# Patient Record
Sex: Male | Born: 1970 | Race: Black or African American | Hispanic: No | Marital: Married | Smoking: Never smoker
Health system: Southern US, Community
[De-identification: ages and names within clinical notes are randomized; demographics above are authoritative.]

## PROBLEM LIST (undated history)

## (undated) DIAGNOSIS — E119 Type 2 diabetes mellitus without complications: Secondary | ICD-10-CM

## (undated) HISTORY — PX: FRACTURE SURGERY: SHX138

---

## 2006-07-24 ENCOUNTER — Emergency Department: Payer: Self-pay | Admitting: Unknown Physician Specialty

## 2007-04-06 ENCOUNTER — Emergency Department: Payer: Self-pay | Admitting: Emergency Medicine

## 2007-04-06 ENCOUNTER — Other Ambulatory Visit: Payer: Self-pay

## 2007-07-23 ENCOUNTER — Emergency Department: Payer: Self-pay | Admitting: Emergency Medicine

## 2007-07-24 ENCOUNTER — Ambulatory Visit: Payer: Self-pay | Admitting: Emergency Medicine

## 2007-07-24 ENCOUNTER — Emergency Department (HOSPITAL_COMMUNITY): Admission: EM | Admit: 2007-07-24 | Discharge: 2007-07-25 | Payer: Self-pay | Admitting: Emergency Medicine

## 2007-12-10 ENCOUNTER — Ambulatory Visit: Payer: Self-pay | Admitting: Internal Medicine

## 2007-12-17 ENCOUNTER — Ambulatory Visit: Payer: Self-pay | Admitting: Internal Medicine

## 2007-12-23 ENCOUNTER — Emergency Department (HOSPITAL_COMMUNITY): Admission: EM | Admit: 2007-12-23 | Discharge: 2007-12-23 | Payer: Self-pay | Admitting: Emergency Medicine

## 2008-01-21 ENCOUNTER — Ambulatory Visit (HOSPITAL_COMMUNITY): Admission: RE | Admit: 2008-01-21 | Discharge: 2008-01-21 | Payer: Self-pay | Admitting: Nephrology

## 2008-01-26 ENCOUNTER — Ambulatory Visit (HOSPITAL_COMMUNITY): Admission: RE | Admit: 2008-01-26 | Discharge: 2008-01-26 | Payer: Self-pay | Admitting: Nephrology

## 2008-03-24 ENCOUNTER — Ambulatory Visit (HOSPITAL_COMMUNITY): Admission: RE | Admit: 2008-03-24 | Discharge: 2008-03-24 | Payer: Self-pay | Admitting: Nephrology

## 2008-12-20 ENCOUNTER — Emergency Department (HOSPITAL_COMMUNITY): Admission: EM | Admit: 2008-12-20 | Discharge: 2008-12-20 | Payer: Self-pay | Admitting: *Deleted

## 2010-03-24 ENCOUNTER — Emergency Department: Payer: Self-pay | Admitting: Emergency Medicine

## 2010-12-05 ENCOUNTER — Emergency Department: Payer: Self-pay | Admitting: Emergency Medicine

## 2010-12-23 ENCOUNTER — Encounter: Payer: Self-pay | Admitting: Nephrology

## 2011-03-23 ENCOUNTER — Emergency Department (HOSPITAL_COMMUNITY)
Admission: EM | Admit: 2011-03-23 | Discharge: 2011-03-23 | Disposition: A | Discharge status: Home / Self Care | Payer: No Typology Code available for payment source | Attending: Emergency Medicine | Admitting: Emergency Medicine

## 2011-03-23 DIAGNOSIS — IMO0002 Reserved for concepts with insufficient information to code with codable children: Secondary | ICD-10-CM | POA: Insufficient documentation

## 2011-03-23 DIAGNOSIS — Y92410 Unspecified street and highway as the place of occurrence of the external cause: Secondary | ICD-10-CM | POA: Insufficient documentation

## 2012-07-30 ENCOUNTER — Emergency Department: Payer: Self-pay | Admitting: Unknown Physician Specialty

## 2012-08-05 ENCOUNTER — Emergency Department: Payer: Self-pay | Admitting: *Deleted

## 2012-08-05 LAB — BASIC METABOLIC PANEL
Creatinine: 1.26 mg/dL (ref 0.60–1.30)
EGFR (African American): >60
EGFR (Non-African Amer.): >60
Glucose: 321 mg/dL — ABNORMAL HIGH (ref 65–99)
Sodium: 136 mmol/L (ref 136–145)

## 2012-08-05 LAB — TROPONIN I: Troponin-I: <0.02 ng/mL

## 2012-08-05 LAB — CBC
MCV: 79 fL — ABNORMAL LOW (ref 80–100)
WBC: 11.3 10*3/uL — ABNORMAL HIGH (ref 3.8–10.6)

## 2012-12-05 ENCOUNTER — Emergency Department: Payer: Self-pay | Admitting: Emergency Medicine

## 2012-12-05 LAB — COMPREHENSIVE METABOLIC PANEL
Alkaline Phosphatase: 109 U/L (ref 50–136)
Bilirubin,Total: 0.3 mg/dL (ref 0.2–1.0)
Calcium, Total: 9.8 mg/dL (ref 8.5–10.1)
Chloride: 99 mmol/L (ref 98–107)
Co2: 27 mmol/L (ref 21–32)
Creatinine: 1.29 mg/dL (ref 0.60–1.30)
EGFR (African American): >60
EGFR (Non-African Amer.): >60
Glucose: 466 mg/dL — ABNORMAL HIGH (ref 65–99)
SGOT(AST): 13 U/L — ABNORMAL LOW (ref 15–37)
Sodium: 135 mmol/L — ABNORMAL LOW (ref 136–145)

## 2012-12-05 LAB — URINALYSIS, COMPLETE
Bacteria: NONE SEEN
Bilirubin,UR: NEGATIVE
Blood: NEGATIVE
Nitrite: NEGATIVE
Ph: 5 (ref 4.5–8.0)
Protein: NEGATIVE
RBC,UR: <1 /HPF (ref 0–5)
Specific Gravity: 1.033 (ref 1.003–1.030)
Squamous Epithelial: <1
WBC UR: NONE SEEN /HPF (ref 0–5)

## 2012-12-05 LAB — CBC WITH DIFFERENTIAL/PLATELET
Basophil #: 0.2 10*3/uL — ABNORMAL HIGH (ref 0.0–0.1)
Basophil %: 1.1 %
Eosinophil #: 0.1 10*3/uL (ref 0.0–0.7)
Eosinophil %: 0.3 %
Lymphocyte %: 16.7 %
MCV: 81 fL (ref 80–100)
Monocyte #: 1.5 x10 3/mm — ABNORMAL HIGH (ref 0.2–1.0)
Monocyte %: 6.7 %
Neutrophil #: 16.8 10*3/uL — ABNORMAL HIGH (ref 1.4–6.5)
Platelet: 241 10*3/uL (ref 150–440)
RDW: 14.6 % — ABNORMAL HIGH (ref 11.5–14.5)
WBC: 22.4 10*3/uL — ABNORMAL HIGH (ref 3.8–10.6)

## 2012-12-07 LAB — BETA STREP CULTURE(ARMC)

## 2014-04-03 ENCOUNTER — Emergency Department: Payer: Self-pay | Admitting: Emergency Medicine

## 2014-04-04 LAB — URINALYSIS, COMPLETE
BLOOD: NEGATIVE
Bacteria: NONE SEEN
Bilirubin,UR: NEGATIVE
Glucose,UR: NEGATIVE mg/dL (ref 0–75)
Ketone: NEGATIVE
Leukocyte Esterase: NEGATIVE
NITRITE: NEGATIVE
Ph: 5 (ref 4.5–8.0)
Protein: NEGATIVE
Specific Gravity: 1.023 (ref 1.003–1.030)
WBC UR: <1 /HPF (ref 0–5)

## 2014-04-04 LAB — BASIC METABOLIC PANEL
Anion Gap: 6 — ABNORMAL LOW (ref 7–16)
BUN: 15 mg/dL (ref 7–18)
CHLORIDE: 103 mmol/L (ref 98–107)
CREATININE: 1.1 mg/dL (ref 0.60–1.30)
Calcium, Total: 9.3 mg/dL (ref 8.5–10.1)
Co2: 28 mmol/L (ref 21–32)
EGFR (Non-African Amer.): >60
Glucose: 160 mg/dL — ABNORMAL HIGH (ref 65–99)
Osmolality: 278 (ref 275–301)
POTASSIUM: 4 mmol/L (ref 3.5–5.1)
SODIUM: 137 mmol/L (ref 136–145)

## 2014-04-04 LAB — CBC WITH DIFFERENTIAL/PLATELET
BASOS ABS: 0 10*3/uL (ref 0.0–0.1)
BASOS PCT: 0.3 %
EOS ABS: 0.2 10*3/uL (ref 0.0–0.7)
EOS PCT: 1.6 %
HCT: 43.9 % (ref 40.0–52.0)
HGB: 14.2 g/dL (ref 13.0–18.0)
LYMPHS PCT: 36.7 %
Lymphocyte #: 4.7 10*3/uL — ABNORMAL HIGH (ref 1.0–3.6)
MCH: 26 pg (ref 26.0–34.0)
MCHC: 32.4 g/dL (ref 32.0–36.0)
MCV: 80 fL (ref 80–100)
Monocyte #: 0.9 x10 3/mm (ref 0.2–1.0)
Monocyte %: 7.3 %
NEUTROS ABS: 7 10*3/uL — AB (ref 1.4–6.5)
NEUTROS PCT: 54.1 %
PLATELETS: 252 10*3/uL (ref 150–440)
RBC: 5.46 10*6/uL (ref 4.40–5.90)
RDW: 14.6 % — ABNORMAL HIGH (ref 11.5–14.5)
WBC: 12.9 10*3/uL — AB (ref 3.8–10.6)

## 2014-04-04 LAB — TROPONIN I: Troponin-I: <0.02 ng/mL

## 2014-05-26 ENCOUNTER — Emergency Department: Payer: Self-pay | Admitting: Emergency Medicine

## 2014-05-26 LAB — URIC ACID: URIC ACID: 6.3 mg/dL (ref 3.5–7.2)

## 2015-04-12 ENCOUNTER — Encounter: Payer: Self-pay | Admitting: Emergency Medicine

## 2015-04-12 ENCOUNTER — Emergency Department
Admission: EM | Admit: 2015-04-12 | Discharge: 2015-04-12 | Discharge status: Against Medical Advice | Payer: Self-pay | Attending: Emergency Medicine | Admitting: Emergency Medicine

## 2015-04-12 DIAGNOSIS — K088 Other specified disorders of teeth and supporting structures: Secondary | ICD-10-CM | POA: Insufficient documentation

## 2015-04-12 DIAGNOSIS — R51 Headache: Secondary | ICD-10-CM | POA: Insufficient documentation

## 2015-04-12 HISTORY — DX: Type 2 diabetes mellitus without complications: E11.9

## 2015-04-12 NOTE — ED Notes (Signed)
Patient ambulatory to triage with steady gait, without difficulty or distress noted; pt reports left sided HA with left toothache tonight

## 2015-10-16 ENCOUNTER — Encounter: Payer: Self-pay | Admitting: Emergency Medicine

## 2015-10-16 ENCOUNTER — Emergency Department
Admission: EM | Admit: 2015-10-16 | Discharge: 2015-10-16 | Disposition: A | Discharge status: Home / Self Care | Payer: BLUE CROSS/BLUE SHIELD | Attending: Emergency Medicine | Admitting: Emergency Medicine

## 2015-10-16 ENCOUNTER — Emergency Department: Payer: BLUE CROSS/BLUE SHIELD

## 2015-10-16 DIAGNOSIS — Z880 Allergy status to penicillin: Secondary | ICD-10-CM | POA: Insufficient documentation

## 2015-10-16 DIAGNOSIS — M5440 Lumbago with sciatica, unspecified side: Secondary | ICD-10-CM | POA: Diagnosis not present

## 2015-10-16 DIAGNOSIS — M79642 Pain in left hand: Secondary | ICD-10-CM | POA: Insufficient documentation

## 2015-10-16 DIAGNOSIS — Z7984 Long term (current) use of oral hypoglycemic drugs: Secondary | ICD-10-CM | POA: Diagnosis not present

## 2015-10-16 DIAGNOSIS — M79641 Pain in right hand: Secondary | ICD-10-CM | POA: Insufficient documentation

## 2015-10-16 DIAGNOSIS — H109 Unspecified conjunctivitis: Secondary | ICD-10-CM | POA: Insufficient documentation

## 2015-10-16 DIAGNOSIS — E119 Type 2 diabetes mellitus without complications: Secondary | ICD-10-CM | POA: Diagnosis not present

## 2015-10-16 DIAGNOSIS — M25551 Pain in right hip: Secondary | ICD-10-CM | POA: Diagnosis not present

## 2015-10-16 DIAGNOSIS — H5712 Ocular pain, left eye: Secondary | ICD-10-CM | POA: Diagnosis present

## 2015-10-16 MED ORDER — CYCLOBENZAPRINE HCL 10 MG PO TABS: 10.0000 mg | ORAL_TABLET | Freq: Three times a day (TID) | ORAL | Status: DC | PRN

## 2015-10-16 MED ORDER — IBUPROFEN 800 MG PO TABS: 800.0000 mg | ORAL_TABLET | Freq: Three times a day (TID) | ORAL | Status: DC | PRN

## 2015-10-16 MED ORDER — SULFACETAMIDE SODIUM 10 % OP SOLN: 2.0000 [drp] | Freq: Four times a day (QID) | OPHTHALMIC | Status: DC

## 2015-10-16 NOTE — ED Provider Notes (Signed)
[facility] Emergency Department Provider Note  ____________________________________________  Time seen: Approximately 10:32 AM  I have reviewed the triage vital signs and the nursing notes.   HISTORY  Chief Complaint Hip Pain; Eye Pain; and Hand Pain    HPI Jerome Conner is a 44 y.o. male presents with complaints of right hip pain with numbness and tingling in his groin. Pain with numbness extending down his leg. Also complains of left eye itching and draining for 3 days, and also complains of bilateral hand pain. 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 Past medical history significant for diabetes without complication.   Past Medical History  Diagnosis Date  . Diabetes mellitus without complication (HCC)     There are no active problems to display for this patient.   History reviewed. No pertinent past surgical history.  Current Outpatient Rx  Name  Route  Sig  Dispense  Refill  . glipiZIDE (GLUCOTROL) 10 MG tablet   Oral   Take 10 mg by mouth daily before breakfast.         . metFORMIN (GLUCOPHAGE) 500 MG tablet   Oral   Take 500 mg by mouth 2 (two) times daily with a meal.         . cyclobenzaprine (FLEXERIL) 10 MG tablet   Oral   Take 1 tablet (10 mg total) by mouth every 8 (eight) hours as needed for muscle spasms.   30 tablet   1   . ibuprofen (ADVIL,MOTRIN) 800 MG tablet   Oral   Take 1 tablet (800 mg total) by mouth every 8 (eight) hours as needed.   30 tablet   0   . sulfacetamide (BLEPH-10) 10 % ophthalmic solution   Both Eyes   Place 2 drops into both eyes 4 (four) times daily.   5 mL   0     Allergies Penicillins  History reviewed. No pertinent family history.  Social History Social History  Substance Use Topics  . Smoking status: Never Smoker   . Smokeless tobacco: None  . Alcohol Use: No    Review of Systems Constitutional: No  fever/chills Eyes: No visual changes. Positive drainage left eye. ENT: No sore throat. Cardiovascular: Denies chest pain. Respiratory: Denies shortness of breath. Gastrointestinal: No abdominal pain.  No nausea, no vomiting.  No diarrhea.  No constipation. Genitourinary: Negative for dysuria. Musculoskeletal: Positive for bilateral hand pain and right hip pain with paresthesia and numbness. Skin: Negative for rash. Neurological: Negative for headaches, focal weakness or numbness.  10-point ROS otherwise negative.  ____________________________________________   PHYSICAL EXAM:  VITAL SIGNS: ED Triage Vitals  Enc Vitals Group     BP --      Pulse --      Resp --      Temp --      Temp src --      SpO2 --      Weight --      Height --      Head Cir --      Peak Flow --      Pain Score 10/16/15 0944 8     Pain Loc --      Pain Edu? --      Excl. in GC? --     Constitutional: Alert and oriented. Well appearing and in no acute distress. Eyes: Left eye with some conjunctival irritation. Drainage noted. EOMI  PERRLA Head: Atraumatic. Nose: No congestion/rhinnorhea. Mouth/Throat: Mucous membranes are moist.  Oropharynx non-erythematous. Neck: No stridor. No Cervical spinal tenderness to palpation Cardiovascular: Normal rate, regular rhythm. Grossly normal heart sounds.  Good peripheral circulation. Respiratory: Normal respiratory effort.  No retractions. Lungs CTAB. Gastrointestinal: Soft and nontender. No distention. No abdominal bruits. No CVA tenderness. Musculoskeletal: No lower extremity tenderness nor edema.  No joint effusions. Neurologic:  Normal speech and language. No gross focal neurologic deficits are appreciated. No gait instability. Skin:  Skin is warm, dry and intact. No rash noted. Psychiatric: Mood and affect are normal. Speech and behavior are normal.  ____________________________________________   LABS (all labs ordered are listed, but only abnormal  results are displayed)  Labs Reviewed - No data to display ____________________________________________   RADIOLOGY  MRI lumbar spine negative for cauda equina syndrome no evidence of DJD. Bilateral hand x-rays both negative and no evidence of osteoarthritis. ____________________________________________   PROCEDURES  Procedure(s) performed: None  Critical Care performed: No  ____________________________________________   INITIAL IMPRESSION / ASSESSMENT AND PLAN / ED COURSE  Pertinent labs & imaging results that were available during my care of the patient were reviewed by me and considered in my medical decision making (see chart for details).  1.left eye conjunctivitis Rx given for sodium Sulamyd eyedrops. 2. Bilateral hand pain Rx given for Motrin 800 mg 3 times a day. 3. Low back pain Rx given for Flexeril 10 mg 3 times a day patient is follow-up PCP or return to ER with any worsening symptomology. ____________________________________________   FINAL CLINICAL IMPRESSION(S) / ED DIAGNOSES  Final diagnoses:  Conjunctivitis of left eye  Low back pain with sciatica, sciatica laterality unspecified, unspecified back pain laterality      Evangeline DakinCharles M Khloe Hunkele, PA-C 10/16/15 1654  Jene Everyobert Kinner, MD 10/19/15 1120

## 2015-10-16 NOTE — ED Notes (Signed)
Pt to ed with c/o right hip pain, states " my doctor sent me for an x ray",  Also c/o left eye pain, +itching, drainage, x 3 days, also c/o bilat hand pain, denies injury, states pain with gripping.

## 2015-10-16 NOTE — Discharge Instructions (Signed)
Bacterial Conjunctivitis Bacterial conjunctivitis, commonly called pink eye, is an inflammation of the clear membrane that covers the white part of the eye (conjunctiva). The inflammation can also happen on the underside of the eyelids. The blood vessels in the conjunctiva become inflamed, causing the eye to become red or pink. Bacterial conjunctivitis may spread easily from one eye to another and from person to person (contagious).  CAUSES  Bacterial conjunctivitis is caused by bacteria. The bacteria may come from your own skin, your upper respiratory tract, or from someone else with bacterial conjunctivitis. SYMPTOMS  The normally white color of the eye or the underside of the eyelid is usually pink or red. The pink eye is usually associated with irritation, tearing, and some sensitivity to light. Bacterial conjunctivitis is often associated with a thick, yellowish discharge from the eye. The discharge may turn into a crust on the eyelids overnight, which causes your eyelids to stick together. If a discharge is present, there may also be some blurred vision in the affected eye. DIAGNOSIS  Bacterial conjunctivitis is diagnosed by your caregiver through an eye exam and the symptoms that you report. Your caregiver looks for changes in the surface tissues of your eyes, which may point to the specific type of conjunctivitis. A sample of any discharge may be collected on a cotton-tip swab if you have a severe case of conjunctivitis, if your cornea is affected, or if you keep getting repeat infections that do not respond to treatment. The sample will be sent to a lab to see if the inflammation is caused by a bacterial infection and to see if the infection will respond to antibiotic medicines. TREATMENT   Bacterial conjunctivitis is treated with antibiotics. Antibiotic eyedrops are most often used. However, antibiotic ointments are also available. Antibiotics pills are sometimes used. Artificial tears or eye  washes may ease discomfort. HOME CARE INSTRUCTIONS   To ease discomfort, apply a cool, clean washcloth to your eye for 10-20 minutes, 3-4 times a day.  Gently wipe away any drainage from your eye with a warm, wet washcloth or a cotton ball.  Wash your hands often with soap and water. Use paper towels to dry your hands.  Do not share towels or washcloths. This may spread the infection.  Change or wash your pillowcase every day.  You should not use eye makeup until the infection is gone.  Do not operate machinery or drive if your vision is blurred.  Stop using contact lenses. Ask your caregiver how to sterilize or replace your contacts before using them again. This depends on the type of contact lenses that you use.  When applying medicine to the infected eye, do not touch the edge of your eyelid with the eyedrop bottle or ointment tube. SEEK IMMEDIATE MEDICAL CARE IF:   Your infection has not improved within 3 days after beginning treatment.  You had yellow discharge from your eye and it returns.  You have increased eye pain.  Your eye redness is spreading.  Your vision becomes blurred.  You have a fever or persistent symptoms for more than 2-3 days.  You have a fever and your symptoms suddenly get worse.  You have facial pain, redness, or swelling. MAKE SURE YOU:   Understand these instructions.  Will watch your condition.  Will get help right away if you are not doing well or get worse.   This information is not intended to replace advice given to you by your health care provider. Make sure you   discuss any questions you have with your health care provider.   Document Released: 11/18/2005 Document Revised: 12/09/2014 Document Reviewed: 04/20/2012 Elsevier Interactive Patient Education 2016 [employer]  Radicular Pain Radicular pain in either the arm or leg is usually from a bulging or herniated disk in the spine. A piece of the herniated disk may press against  the nerves as the nerves exit the spine. This causes pain which is felt at the tips of the nerves down the arm or leg. Other causes of radicular pain may include:  Fractures.  Heart disease.  Cancer.  An abnormal and usually degenerative state of the nervous system or nerves (neuropathy). Diagnosis may require CT or MRI scanning to determine the primary cause.  Nerves that start at the neck (nerve roots) may cause radicular pain in the outer shoulder and arm. It can spread down to the thumb and fingers. The symptoms vary depending on which nerve root has been affected. In most cases radicular pain improves with conservative treatment. Neck problems may require physical therapy, a neck collar, or cervical traction. Treatment may take many weeks, and surgery may be considered if the symptoms do not improve.  Conservative treatment is also recommended for sciatica. Sciatica causes pain to radiate from the lower back or buttock area down the leg into the foot. Often there is a history of back problems. Most patients with sciatica are better after 2 to 4 weeks of rest and other supportive care. Short term bed rest can reduce the disk pressure considerably. Sitting, however, is not a good position since this increases the pressure on the disk. You should avoid bending, lifting, and all other activities which make the problem worse. Traction can be used in severe cases. Surgery is usually reserved for patients who do not improve within the first months of treatment. Only take over-the-counter or prescription medicines for pain, discomfort, or fever as directed by your caregiver. Narcotics and muscle relaxants may help by relieving more severe pain and spasm and by providing mild sedation. Cold or massage can give significant relief. Spinal manipulation is not recommended. It can increase the degree of disc protrusion. Epidural steroid injections are often effective treatment for radicular pain. These injections  deliver medicine to the spinal nerve in the space between the protective covering of the spinal cord and back bones (vertebrae). Your caregiver can give you more information about steroid injections. These injections are most effective when given within two weeks of the onset of pain.  You should see your caregiver for follow up care as recommended. A program for neck and back injury rehabilitation with stretching and strengthening exercises is an important part of management.  SEEK IMMEDIATE MEDICAL CARE IF:  You develop increased pain, weakness, or numbness in your arm or leg.  You develop difficulty with bladder or bowel control.  You develop abdominal pain.   This information is not intended to replace advice given to you by your health care provider. Make sure you discuss any questions you have with your health care provider.   Document Released: 12/26/2004 Document Revised: 12/09/2014 Document Reviewed: 06/14/2015 Elsevier Interactive Patient Education 2016 [employer]  Sciatica Sciatica is pain, weakness, numbness, or tingling along your sciatic nerve. The nerve starts in the lower back and runs down the back of each leg. Nerve damage or certain conditions pinch or put pressure on the sciatic nerve. This causes the pain, weakness, and other discomforts of sciatica. HOME CARE   Only take medicine as told  by your doctor.  Apply ice to the affected area for 20 minutes. Do this 3-4 times a day for the first 48-72 hours. Then try heat in the same way.  Exercise, stretch, or do your usual activities if these do not make your pain worse.  Go to physical therapy as told by your doctor.  Keep all doctor visits as told.  Do not wear high heels or shoes that are not supportive.  Get a firm mattress if your mattress is too soft to lessen pain and discomfort. GET HELP RIGHT AWAY IF:   You cannot control when you poop (bowel movement) or pee (urinate).  You have more weakness in your  lower back, lower belly (pelvis), butt (buttocks), or legs.  You have redness or puffiness (swelling) of your back.  You have a burning feeling when you pee.  You have pain that gets worse when you lie down.  You have pain that wakes you from your sleep.  Your pain is worse than past pain.  Your pain lasts longer than 4 weeks.  You are suddenly losing weight without reason. MAKE SURE YOU:   Understand these instructions.  Will watch this condition.  Will get help right away if you are not doing well or get worse.   This information is not intended to replace advice given to you by your health care provider. Make sure you discuss any questions you have with your health care provider.   Document Released: 08/27/2008 Document Revised: 08/09/2015 Document Reviewed: 03/29/2012 Elsevier Interactive Patient Education [employer]

## 2016-04-11 ENCOUNTER — Emergency Department: Payer: BLUE CROSS/BLUE SHIELD

## 2016-04-11 ENCOUNTER — Encounter: Payer: Self-pay | Admitting: Emergency Medicine

## 2016-04-11 ENCOUNTER — Emergency Department
Admission: EM | Admit: 2016-04-11 | Discharge: 2016-04-11 | Disposition: A | Discharge status: Home / Self Care | Payer: BLUE CROSS/BLUE SHIELD | Attending: Emergency Medicine | Admitting: Emergency Medicine

## 2016-04-11 DIAGNOSIS — M7712 Lateral epicondylitis, left elbow: Secondary | ICD-10-CM | POA: Diagnosis not present

## 2016-04-11 DIAGNOSIS — Z7984 Long term (current) use of oral hypoglycemic drugs: Secondary | ICD-10-CM | POA: Insufficient documentation

## 2016-04-11 DIAGNOSIS — M25521 Pain in right elbow: Secondary | ICD-10-CM | POA: Diagnosis present

## 2016-04-11 DIAGNOSIS — E119 Type 2 diabetes mellitus without complications: Secondary | ICD-10-CM | POA: Insufficient documentation

## 2016-04-11 DIAGNOSIS — M7711 Lateral epicondylitis, right elbow: Secondary | ICD-10-CM

## 2016-04-11 MED ORDER — MELOXICAM 15 MG PO TABS: 15.0000 mg | ORAL_TABLET | Freq: Every day | ORAL | Status: DC

## 2016-04-11 NOTE — Discharge Instructions (Signed)
Tennis Elbow Tennis elbow (lateral epicondylitis) is inflammation of the outer tendons of your forearm close to your elbow. Your tendons attach your muscles to your bones. The outer tendons of your forearm are used to extend your wrist, and they attach on the outside part of your elbow. Tennis elbow is often found in people who play tennis, but anyone may get the condition from repeatedly extending the wrist or turning the forearm. CAUSES This condition is caused by repeatedly extending your wrist and using your hands. It can result from sports or work that requires repetitive forearm movements. Tennis elbow may also be caused by an injury. RISK FACTORS You have a higher risk of developing tennis elbow if you play tennis or another racquet sport. You also have a higher risk if you frequently use your hands for work. This condition is also more likely to develop in:  Musicians.  Carpenters, painters, and plumbers.  Cooks.  Cashiers.  People who work in factories.  Construction workers.  Butchers.  People who use computers. SYMPTOMS Symptoms of this condition include:  Pain and tenderness in your forearm and the outer part of your elbow. You may only feel the pain when you use your arm, or you may feel it even when you are not using your arm.  A burning feeling that runs from your elbow through your arm.  Weak grip in your hands. DIAGNOSIS  This condition may be diagnosed by medical history and physical exam. You may also have other tests, including:  X-rays.  MRI. TREATMENT Your health care provider will recommend lifestyle adjustments, such as resting and icing your arm. Treatment may also include:  Medicines for inflammation. This may include shots of cortisone if your pain continues.  Physical therapy. This may include massage or exercises.  An elbow brace. Surgery may eventually be recommended if your pain does not go away with treatment. HOME CARE  INSTRUCTIONS Activity  Rest your elbow and wrist as directed by your health care provider. Try to avoid any activities that caused the problem until your health care provider says that you can do them again.  If a physical therapist teaches you exercises, do all of them as directed.  If you lift an object, lift it with your palm facing upward. This lowers the stress on your elbow. Lifestyle  If your tennis elbow is caused by sports, check your equipment and make sure that:  You are using it correctly.  It is the best fit for you.  If your tennis elbow is caused by work, take breaks frequently, if you are able. Talk with your manager about how to best perform tasks in a way that is safe.  If your tennis elbow is caused by computer use, talk with your manager about any changes that can be made to your work environment. General Instructions  If directed, apply ice to the painful area:  Put ice in a plastic bag.  Place a towel between your skin and the bag.  Leave the ice on for 20 minutes, 2-3 times per day.  Take medicines only as directed by your health care provider.  If you were given a brace, wear it as directed by your health care provider.  Keep all follow-up visits as directed by your health care provider. This is important. SEEK MEDICAL CARE IF:  Your pain does not get better with treatment.  Your pain gets worse.  You have numbness or weakness in your forearm, hand, or fingers.     This information is not intended to replace advice given to you by your health care provider. Make sure you discuss any questions you have with your health care provider.   Document Released: 11/18/2005 Document Revised: 04/04/2015 Document Reviewed: 11/14/2014 Elsevier Interactive Patient Education 2016 [employer] Inc.  

## 2016-04-11 NOTE — ED Notes (Signed)
Developed pain to right elbow for about 1 month  Unsure of injury

## 2016-04-11 NOTE — ED Provider Notes (Signed)
[facility] Emergency Department Provider Note  ____________________________________________  Time seen: Approximately 6:47 PM  I have reviewed the triage vital signs and the nursing notes.   HISTORY  Chief Complaint Elbow Pain    HPI Jerome Conner is a 45 y.o. male who presents emergency department complaining of right elbow pain 1 month. Patient denies any injury precipitating this pain. Patient states that the pain is a burning sensation located on the lateral aspect of the right elbow. It occasionally radiates up to his shoulder and down to his wrist. Patient denies any numbness or tingling in his hands or wrist. Patient denies taking any medications prior to arrival. Patient denies any loss of range of motion to the arm.   Past Medical History  Diagnosis Date  . Diabetes mellitus without complication (HCC)     There are no active problems to display for this patient.   History reviewed. No pertinent past surgical history.  Current Outpatient Rx  Name  Route  Sig  Dispense  Refill  . cyclobenzaprine (FLEXERIL) 10 MG tablet   Oral   Take 1 tablet (10 mg total) by mouth every 8 (eight) hours as needed for muscle spasms.   30 tablet   1   . glipiZIDE (GLUCOTROL) 10 MG tablet   Oral   Take 10 mg by mouth daily before breakfast.         . ibuprofen (ADVIL,MOTRIN) 800 MG tablet   Oral   Take 1 tablet (800 mg total) by mouth every 8 (eight) hours as needed.   30 tablet   0   . meloxicam (MOBIC) 15 MG tablet   Oral   Take 1 tablet (15 mg total) by mouth daily.   30 tablet   0   . metFORMIN (GLUCOPHAGE) 500 MG tablet   Oral   Take 500 mg by mouth 2 (two) times daily with a meal.         . sulfacetamide (BLEPH-10) 10 % ophthalmic solution   Both Eyes   Place 2 drops into both eyes 4 (four) times daily.   5 mL   0     Allergies Penicillins  No family history on file.  Social History Social History  Substance Use Topics   . Smoking status: Never Smoker   . Smokeless tobacco: None  . Alcohol Use: No     Review of Systems  Constitutional: No fever/chills Cardiovascular: no chest pain. Respiratory: no cough. No SOB. Musculoskeletal: Positive for right elbow pain Skin: Negative for rash, abrasions, lacerations, ecchymosis. Neurological: Negative for headaches, focal weakness or numbness. 10-point ROS otherwise negative.  ____________________________________________   PHYSICAL EXAM:  VITAL SIGNS: ED Triage Vitals  Enc Vitals Group     BP 04/11/16 1747 141/98 mmHg     Pulse Rate 04/11/16 1747 83     Resp 04/11/16 1747 20     Temp 04/11/16 1747 99.1 F (37.3 C)     Temp Source 04/11/16 1747 Oral     SpO2 04/11/16 1747 98 %     Weight 04/11/16 1747 255 lb (115.667 kg)     Height 04/11/16 1747  (1.753 m)     Head Cir --      Peak Flow --      Pain Score 04/11/16 1746 9     Pain Loc --      Pain Edu? --      Excl. in GC? --      Constitutional: Alert  and oriented. Well appearing and in no acute distress. Eyes: Conjunctivae are normal. PERRL. EOMI. Head: Atraumatic. Cardiovascular: Normal rate, regular rhythm. Normal S1 and S2.  Good peripheral circulation. Respiratory: Normal respiratory effort without tachypnea or retractions. Lungs CTAB. Good air entry to the bases with no decreased or absent breath sounds. Musculoskeletal: Full range of motion to all extremities. No gross deformities appreciated.No deformities or edema are noted to the right elbow. Patient has full range of motion to elbow. Patient is tender to palpation right lateral aspect of the elbow. This tenderness to palpation is in the muscle belly just distal to lateral epicondyles. No palpable abnormality. Examination of the wrist and shoulder are unremarkable. Radial pulses appreciated. Sensation intact right upper extremity. Neurologic:  Normal speech and language. No gross focal neurologic deficits are appreciated.  Skin:   Skin is warm, dry and intact. No rash noted. Psychiatric: Mood and affect are normal. Speech and behavior are normal. Patient exhibits appropriate insight and judgement.   ____________________________________________   LABS (all labs ordered are listed, but only abnormal results are displayed)  Labs Reviewed - No data to display ____________________________________________  EKG   ____________________________________________  RADIOLOGY Festus BarrenI, Jonathan D Cuthriell, personally viewed and evaluated these images (plain radiographs) as part of my medical decision making, as well as reviewing the written report by the radiologist.  Dg Elbow Complete Right  04/11/2016  CLINICAL DATA:  Right elbow pain for 1 month.  No known injury. EXAM: RIGHT ELBOW - COMPLETE 3+ VIEW COMPARISON:  None. FINDINGS: The joint spaces are maintained. No acute bony abnormality. No osteochondral lesion. No joint effusion. No abnormal soft tissue calcifications. IMPRESSION: No acute bony findings, significant degenerative changes or joint effusion. Electronically Signed   By: Rudie MeyerP.  Gallerani M.D.   On: 04/11/2016 18:30    ____________________________________________    PROCEDURES  Procedure(s) performed:       Medications - No data to display   ____________________________________________   INITIAL IMPRESSION / ASSESSMENT AND PLAN / ED COURSE  Pertinent labs & imaging results that were available during my care of the patient were reviewed by me and considered in my medical decision making (see chart for details).  Patient's diagnosis is consistent with lateral epicondylitis of the right elbow. Exam is reassuring. X-ray reveals no acute osseous abnormality.. Patient will be discharged home with prescriptions for anti-inflammatories for symptom control. Patient is to follow up with orthopedics as needed or otherwise directed. Patient is given ED precautions to return to the ED for any worsening or new  symptoms.     ____________________________________________  FINAL CLINICAL IMPRESSION(S) / ED DIAGNOSES  Final diagnoses:  Lateral epicondylitis, right      NEW MEDICATIONS STARTED DURING THIS VISIT:  New Prescriptions   MELOXICAM (MOBIC) 15 MG TABLET    Take 1 tablet (15 mg total) by mouth daily.        This chart was dictated using voice recognition software/Dragon. Despite best efforts to proofread, errors can occur which can change the meaning. Any change was purely unintentional.    Racheal PatchesJonathan D Cuthriell, PA-C 04/11/16 1855  Emily FilbertJonathan E Williams, MD 04/11/16 [phone]

## 2016-05-08 ENCOUNTER — Emergency Department
Admission: EM | Admit: 2016-05-08 | Discharge: 2016-05-08 | Disposition: A | Discharge status: Home / Self Care | Payer: BLUE CROSS/BLUE SHIELD | Attending: Emergency Medicine | Admitting: Emergency Medicine

## 2016-05-08 DIAGNOSIS — Z79899 Other long term (current) drug therapy: Secondary | ICD-10-CM | POA: Insufficient documentation

## 2016-05-08 DIAGNOSIS — M7711 Lateral epicondylitis, right elbow: Secondary | ICD-10-CM | POA: Insufficient documentation

## 2016-05-08 DIAGNOSIS — Z7984 Long term (current) use of oral hypoglycemic drugs: Secondary | ICD-10-CM | POA: Insufficient documentation

## 2016-05-08 MED ORDER — HYDROCODONE-ACETAMINOPHEN 5-325 MG PO TABS: 1.0000 | ORAL_TABLET | ORAL | Status: DC | PRN

## 2016-05-08 MED ORDER — MELOXICAM 15 MG PO TABS: 15.0000 mg | ORAL_TABLET | Freq: Every day | ORAL | Status: DC

## 2016-05-08 NOTE — ED Provider Notes (Signed)
[facility] Emergency Department Provider Note  ____________________________________________  Time seen: Approximately 6:17 PM  I have reviewed the triage vital signs and the nursing notes.   HISTORY  Chief Complaint Arm Pain    HPI Jerome Conner is a 45 y.o. male returns to emergency department complaining of right elbow pain. Patient was seen in this department by myself 3 weeks prior and diagnosed with lateral enterocolitis. Patient has been taking oral anti-inflammatory medication since initial encounter and states that this has not resolved his symptoms. Patient still endorses pain to the lateral right elbow. He does endorse some pain radiating down his forearm. Occasionally he has mild numbness and tingling of his fingers but states that this resolves. He denies any shoulder pain or neck pain. No recent injury to area.   Past Medical History  Diagnosis Date  . Diabetes mellitus without complication (HCC)     There are no active problems to display for this patient.   History reviewed. No pertinent past surgical history.  Current Outpatient Rx  Name  Route  Sig  Dispense  Refill  . cyclobenzaprine (FLEXERIL) 10 MG tablet   Oral   Take 1 tablet (10 mg total) by mouth every 8 (eight) hours as needed for muscle spasms.   30 tablet   1   . glipiZIDE (GLUCOTROL) 10 MG tablet   Oral   Take 10 mg by mouth daily before breakfast.         . HYDROcodone-acetaminophen (NORCO/VICODIN) 5-325 MG tablet   Oral   Take 1 tablet by mouth every 4 (four) hours as needed for moderate pain.   15 tablet   0   . ibuprofen (ADVIL,MOTRIN) 800 MG tablet   Oral   Take 1 tablet (800 mg total) by mouth every 8 (eight) hours as needed.   30 tablet   0   . meloxicam (MOBIC) 15 MG tablet   Oral   Take 1 tablet (15 mg total) by mouth daily.   30 tablet   2   . metFORMIN (GLUCOPHAGE) 500 MG tablet   Oral   Take 500 mg by mouth 2 (two) times daily with a  meal.         . sulfacetamide (BLEPH-10) 10 % ophthalmic solution   Both Eyes   Place 2 drops into both eyes 4 (four) times daily.   5 mL   0     Allergies Penicillins  No family history on file.  Social History Social History  Substance Use Topics  . Smoking status: Never Smoker   . Smokeless tobacco: None  . Alcohol Use: No     Review of Systems  Constitutional: No fever/chills Cardiovascular: no chest pain. Respiratory: no cough. No SOB. Musculoskeletal: Positive for right lateral elbow pain. Skin: Negative for rash, abrasions, lacerations, ecchymosis. Neurological: Negative for headaches, focal weakness or numbness. 10-point ROS otherwise negative.  ____________________________________________   PHYSICAL EXAM:  VITAL SIGNS: ED Triage Vitals  Enc Vitals Group     BP 05/08/16 1729 134/75 mmHg     Pulse Rate 05/08/16 1729 98     Resp 05/08/16 1729 18     Temp 05/08/16 1729 98.3 F (36.8 C)     Temp Source 05/08/16 1729 Oral     SpO2 05/08/16 1729 99 %     Weight 05/08/16 1729 255 lb (115.667 kg)     Height --      Head Cir --      Peak  Flow --      Pain Score 05/08/16 1729 8     Pain Loc --      Pain Edu? --      Excl. in GC? --      Constitutional: Alert and oriented. Well appearing and in no acute distress. Eyes: Conjunctivae are normal. PERRL. EOMI. Head: Atraumatic. Cardiovascular: Normal rate, regular rhythm. Normal S1 and S2.  Good peripheral circulation. Respiratory: Normal respiratory effort without tachypnea or retractions. Lungs CTAB. Good air entry to the bases with no decreased or absent breath sounds. Musculoskeletal: Full range of motion to all extremities. No gross deformities appreciated.No visible deformity or edema noted to right elbow. Inspection. Full range of motion. Patient is tender to palpation over the distal aspect of the lateral upper condyle. Neurologic:  Normal speech and language. No gross focal neurologic deficits are  appreciated.  Skin:  Skin is warm, dry and intact. No rash noted. Psychiatric: Mood and affect are normal. Speech and behavior are normal. Patient exhibits appropriate insight and judgement.   ____________________________________________   LABS (all labs ordered are listed, but only abnormal results are displayed)  Labs Reviewed - No data to display ____________________________________________  EKG   ____________________________________________  RADIOLOGY   Imaging from 04/01/2016 was reviewed ____________________________________________    PROCEDURES  Procedure(s) performed:       Medications - No data to display   ____________________________________________   INITIAL IMPRESSION / ASSESSMENT AND PLAN / ED COURSE  Pertinent labs & imaging results that were available during my care of the patient were reviewed by me and considered in my medical decision making (see chart for details).  Patient's diagnosis is consistent with lateral epicondylitis. Patient has tried NSAID's x 3 weeks with no relief of symptoms. At this time it is felt that patient be best served seeing orthopedics for steroid injection. Patient will follow-up with orthopedics. NSAIDs are refilled and he is given limited pain medication for use at nighttime for pain..  Patient is given ED precautions to return to the ED for any worsening or new symptoms.     ____________________________________________  FINAL CLINICAL IMPRESSION(S) / ED DIAGNOSES  Final diagnoses:  Lateral epicondylitis (tennis elbow), right      NEW MEDICATIONS STARTED DURING THIS VISIT:  Discharge Medication List as of 05/08/2016  6:30 PM    START taking these medications   Details  HYDROcodone-acetaminophen (NORCO/VICODIN) 5-325 MG tablet Take 1 tablet by mouth every 4 (four) hours as needed for moderate pain., Starting 05/08/2016, Until Discontinued, Print            This chart was dictated using voice  recognition software/Dragon. Despite best efforts to proofread, errors can occur which can change the meaning. Any change was purely unintentional.    Racheal Patches, PA-C 05/08/16 1859  Governor Rooks, MD 05/08/16 2248

## 2016-05-08 NOTE — Discharge Instructions (Signed)
Tendinitis °Tendinitis is swelling and inflammation of the tendons. Tendons are band-like tissues that connect muscle to bone. Tendinitis commonly occurs in the:  °· Shoulders (rotator cuff). °· Heels (Achilles tendon). °· Elbows (triceps tendon). °CAUSES °Tendinitis is usually caused by overusing the tendon, muscles, and joints involved. When the tissue surrounding a tendon (synovium) becomes inflamed, it is called tenosynovitis. Tendinitis commonly develops in people whose jobs require repetitive motions. °SYMPTOMS °· Pain. °· Tenderness. °· Mild swelling. °DIAGNOSIS °Tendinitis is usually diagnosed by physical exam. Your health care provider may also order X-rays or other imaging tests. °TREATMENT °Your health care provider may recommend certain medicines or exercises for your treatment. °HOME CARE INSTRUCTIONS  °· Use a sling or splint for as long as directed by your health care provider until the pain decreases. °· Put ice on the injured area. °· Put ice in a plastic bag. °· Place a towel between your skin and the bag. °· Leave the ice on for 15-20 minutes, 3-4 times a day, or as directed by your health care provider. °· Avoid using the limb while the tendon is painful. Perform gentle range of motion exercises only as directed by your health care provider. Stop exercises if pain or discomfort increase, unless directed otherwise by your health care provider. °· Only take over-the-counter or prescription medicines for pain, discomfort, or fever as directed by your health care provider. °SEEK MEDICAL CARE IF:  °· Your pain and swelling increase. °· You develop new, unexplained symptoms, especially increased numbness in the hands. °MAKE SURE YOU:  °· Understand these instructions. °· Will watch your condition. °· Will get help right away if you are not doing well or get worse. °  °This information is not intended to replace advice given to you by your health care provider. Make sure you discuss any questions you  have with your health care provider. °  °Document Released: 11/15/2000 Document Revised: 12/09/2014 Document Reviewed: 02/04/2011 °Elsevier Interactive Patient Education ©2016 [employer] ° °Tennis Elbow °Tennis elbow (lateral epicondylitis) is inflammation of the outer tendons of your forearm close to your elbow. Your tendons attach your muscles to your bones. The outer tendons of your forearm are used to extend your wrist, and they attach on the outside part of your elbow. Tennis elbow is often found in people who play tennis, but anyone may get the condition from repeatedly extending the wrist or turning the forearm. °CAUSES °This condition is caused by repeatedly extending your wrist and using your hands. It can result from sports or work that requires repetitive forearm movements. Tennis elbow may also be caused by an injury. °RISK FACTORS °You have a higher risk of developing tennis elbow if you play tennis or another racquet sport. You also have a higher risk if you frequently use your hands for work. This condition is also more likely to develop in: °· Musicians. °· Carpenters, painters, and plumbers. °· Cooks. °· Cashiers. °· People who work in factories. °· Construction workers. °· Butchers. °· People who use computers. °SYMPTOMS °Symptoms of this condition include: °· Pain and tenderness in your forearm and the outer part of your elbow. You may only feel the pain when you use your arm, or you may feel it even when you are not using your arm. °· A burning feeling that runs from your elbow through your arm. °· Weak grip in your hands. °DIAGNOSIS  °This condition may be diagnosed by medical history and physical exam. You may also have other   tests, including: °· X-rays. °· MRI. °TREATMENT °Your health care provider will recommend lifestyle adjustments, such as resting and icing your arm. Treatment may also include: °· Medicines for inflammation. This may include shots of cortisone if your pain  continues. °· Physical therapy. This may include massage or exercises. °· An elbow brace. °Surgery may eventually be recommended if your pain does not go away with treatment. °HOME CARE INSTRUCTIONS °Activity °· Rest your elbow and wrist as directed by your health care provider. Try to avoid any activities that caused the problem until your health care provider says that you can do them again. °· If a physical therapist teaches you exercises, do all of them as directed. °· If you lift an object, lift it with your palm facing upward. This lowers the stress on your elbow. °Lifestyle °· If your tennis elbow is caused by sports, check your equipment and make sure that: °¨ You are using it correctly. °¨ It is the best fit for you. °· If your tennis elbow is caused by work, take breaks frequently, if you are able. Talk with your manager about how to best perform tasks in a way that is safe. °¨ If your tennis elbow is caused by computer use, talk with your manager about any changes that can be made to your work environment. °General Instructions °· If directed, apply ice to the painful area: °¨ Put ice in a plastic bag. °¨ Place a towel between your skin and the bag. °¨ Leave the ice on for 20 minutes, 2-3 times per day. °· Take medicines only as directed by your health care provider. °· If you were given a brace, wear it as directed by your health care provider. °· Keep all follow-up visits as directed by your health care provider. This is important. °SEEK MEDICAL CARE IF: °· Your pain does not get better with treatment. °· Your pain gets worse. °· You have numbness or weakness in your forearm, hand, or fingers. °  °This information is not intended to replace advice given to you by your health care provider. Make sure you discuss any questions you have with your health care provider. °  °Document Released: 11/18/2005 Document Revised: 04/04/2015 Document Reviewed: 11/14/2014 °Elsevier Interactive Patient Education ©2016  [employer] ° °

## 2016-05-08 NOTE — ED Notes (Signed)
Pt arrives to ER via POV c/o right elbow pain x 3 weeks. Pt seen in ER and told to come back if not better; diagnosed with tendonitis. Pt states pain no better. Marcello [email or web address]

## 2016-05-08 NOTE — ED Notes (Signed)
Pt sts that he seen here 3 weeks ago and dx w/ tendonitis and told to return if not any better.  Pt sts antiinflammatory not helping.

## 2016-10-03 DIAGNOSIS — E119 Type 2 diabetes mellitus without complications: Secondary | ICD-10-CM | POA: Insufficient documentation

## 2017-05-13 ENCOUNTER — Emergency Department
Admission: EM | Admit: 2017-05-13 | Discharge: 2017-05-13 | Disposition: A | Discharge status: Home / Self Care | Payer: BLUE CROSS/BLUE SHIELD | Attending: Emergency Medicine | Admitting: Emergency Medicine

## 2017-05-13 ENCOUNTER — Encounter: Payer: Self-pay | Admitting: *Deleted

## 2017-05-13 DIAGNOSIS — E119 Type 2 diabetes mellitus without complications: Secondary | ICD-10-CM | POA: Diagnosis not present

## 2017-05-13 DIAGNOSIS — Z7984 Long term (current) use of oral hypoglycemic drugs: Secondary | ICD-10-CM | POA: Insufficient documentation

## 2017-05-13 DIAGNOSIS — Z79899 Other long term (current) drug therapy: Secondary | ICD-10-CM | POA: Diagnosis not present

## 2017-05-13 DIAGNOSIS — R1012 Left upper quadrant pain: Secondary | ICD-10-CM | POA: Diagnosis not present

## 2017-05-13 DIAGNOSIS — M7918 Myalgia, other site: Secondary | ICD-10-CM

## 2017-05-13 LAB — COMPREHENSIVE METABOLIC PANEL
ALBUMIN: 4 g/dL (ref 3.5–5.0)
ALK PHOS: 49 U/L (ref 38–126)
ALT: 24 U/L (ref 17–63)
AST: 22 U/L (ref 15–41)
Anion gap: 7 (ref 5–15)
BILIRUBIN TOTAL: 0.4 mg/dL (ref 0.3–1.2)
BUN: 17 mg/dL (ref 6–20)
CO2: 26 mmol/L (ref 22–32)
Calcium: 9.6 mg/dL (ref 8.9–10.3)
Chloride: 102 mmol/L (ref 101–111)
Creatinine, Ser: 1.1 mg/dL (ref 0.61–1.24)
GFR calc Af Amer: >60 mL/min (ref >60)
GFR calc non Af Amer: >60 mL/min (ref >60)
GLUCOSE: 163 mg/dL — AB (ref 65–99)
POTASSIUM: 4.2 mmol/L (ref 3.5–5.1)
SODIUM: 135 mmol/L (ref 135–145)
Total Protein: 7.9 g/dL (ref 6.5–8.1)

## 2017-05-13 LAB — CBC
HCT: 41.5 % (ref 40.0–52.0)
HEMOGLOBIN: 13.8 g/dL (ref 13.0–18.0)
MCH: 26.6 pg (ref 26.0–34.0)
MCHC: 33.1 g/dL (ref 32.0–36.0)
MCV: 80.2 fL (ref 80.0–100.0)
Platelets: 234 10*3/uL (ref 150–440)
RBC: 5.18 MIL/uL (ref 4.40–5.90)
RDW: 15.1 % — AB (ref 11.5–14.5)
WBC: 14.3 10*3/uL — ABNORMAL HIGH (ref 3.8–10.6)

## 2017-05-13 LAB — URINALYSIS, COMPLETE (UACMP) WITH MICROSCOPIC
BACTERIA UA: NONE SEEN
Bilirubin Urine: NEGATIVE
GLUCOSE, UA: NEGATIVE mg/dL
Hgb urine dipstick: NEGATIVE
Ketones, ur: NEGATIVE mg/dL
Leukocytes, UA: NEGATIVE
NITRITE: NEGATIVE
Protein, ur: NEGATIVE mg/dL
RBC / HPF: NONE SEEN RBC/hpf (ref 0–5)
SQUAMOUS EPITHELIAL / LPF: NONE SEEN
Specific Gravity, Urine: 1.023 (ref 1.005–1.030)
pH: 5 (ref 5.0–8.0)

## 2017-05-13 LAB — TROPONIN I

## 2017-05-13 LAB — LIPASE, BLOOD: Lipase: 24 U/L (ref 11–51)

## 2017-05-13 MED ORDER — LIDOCAINE 5 % EX PTCH: 1.0000 | MEDICATED_PATCH | Freq: Two times a day (BID) | CUTANEOUS | 0 refills | Status: AC

## 2017-05-13 NOTE — ED Triage Notes (Signed)
Pt has left upper abd pain for 2 days.  No n/v/d. Reports dull ache.  No chest pain or sob. Pt does have back pain.  No diff urinating. Pt alert.

## 2017-05-13 NOTE — ED Notes (Signed)
Pt verbalizes understanding of discharge instructions.

## 2017-05-13 NOTE — ED Provider Notes (Signed)
[facility] Emergency Department Provider Note  ____________________________________________   First MD Initiated Contact with Patient 05/13/17 2126     (approximate)  I have reviewed the triage vital signs and the nursing notes.   HISTORY  Chief Complaint Abdominal Pain    HPI Jerome Conner is a 46 y.o. male with history of uncomplicated diabeteswho presents for evaluation of pain in his left upper abdomen.  He states his been present for about 2 days.  He describes it as a dull ache that is mild in severity and increases to moderate with exertion and muscle usage.  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  He denies chest pain, shortness of breath, fever/chills, nausea, vomiting, diarrhea, constipation, dysuria, lower abdominal pain.   Past Medical History:  Diagnosis Date  . Diabetes mellitus without complication (HCC)     There are no active problems to display for this patient.   No past surgical history on file.  Prior to Admission medications   Medication Sig Start Date End Date Taking? Authorizing Provider  cyclobenzaprine (FLEXERIL) 10 MG tablet Take 1 tablet (10 mg total) by mouth every 8 (eight) hours as needed for muscle spasms. 10/16/15   Beers, Charmayne Sheer, PA-C  glipiZIDE (GLUCOTROL) 10 MG tablet Take 10 mg by mouth daily before breakfast.    [provider]  HYDROcodone-acetaminophen (NORCO/VICODIN) 5-325 MG tablet Take 1 tablet by mouth every 4 (four) hours as needed for moderate pain. 05/08/16   Cuthriell, Delorise Royals, PA-C  ibuprofen (ADVIL,MOTRIN) 800 MG tablet Take 1 tablet (800 mg total) by mouth every 8 (eight) hours as needed. 10/16/15   Beers, Charmayne Sheer, PA-C  lidocaine (LIDODERM) 5 % Place 1 patch onto the skin every 12 (twelve) hours. Remove & Discard patch within 12 hours  or as directed by MD.  Wynelle Fanny the patch off for 12 hours before applying a new one. 05/13/17 05/13/18  Loleta Rose, MD  meloxicam (MOBIC) 15 MG tablet Take 1 tablet (15 mg total) by mouth daily. 05/08/16   Cuthriell, Delorise Royals, PA-C  metFORMIN (GLUCOPHAGE) 500 MG tablet Take 500 mg by mouth 2 (two) times daily with a meal.    [provider]  sulfacetamide (BLEPH-10) 10 % ophthalmic solution Place 2 drops into both eyes 4 (four) times daily. 10/16/15   Beers, Charmayne Sheer, PA-C    Allergies Penicillins  No family history on file.  Social History Social History  Substance Use Topics  . Smoking status: Never Smoker  . Smokeless tobacco: Never Used  . Alcohol use No    Review of Systems Constitutional: No fever/chills Eyes: No visual changes. ENT: No sore throat. Cardiovascular: Denies chest pain. Respiratory: Denies shortness of breath. Gastrointestinal: 2 days of dull aching left upper quadrant pain that is worse with muscle usage.  Denies N/V/D Genitourinary: Negative for dysuria. Musculoskeletal: Negative for neck pain.  Negative for back pain. Integumentary: Negative for rash. Neurological: Negative for headaches, focal weakness or numbness.   ____________________________________________   PHYSICAL EXAM:  VITAL SIGNS: ED Triage Vitals  Enc Vitals Group     BP 05/13/17 1810 (!) 144/88     Pulse Rate 05/13/17 1808 87     Resp 05/13/17 1808 20     Temp 05/13/17 1808 98.3 F (36.8 C)  Temp Source 05/13/17 1808 Oral     SpO2 05/13/17 1808 100 %     Weight 05/13/17 1810 120.2 kg (265 lb)     Height 05/13/17 1810 1.753 m (5\' 9" )     Head Circumference --      Peak Flow --      Pain Score 05/13/17 1808 5     Pain Loc --      Pain Edu? --      Excl. in GC? --     Constitutional: Alert and oriented. Well appearing and in no acute distress. Eyes: Conjunctivae are normal.  Head: Atraumatic. Nose: No congestion/rhinnorhea. Mouth/Throat: Mucous membranes are  moist. Neck: No stridor.  No meningeal signs.   Cardiovascular: Normal rate, regular rhythm. Good peripheral circulation. Grossly normal heart sounds. Respiratory: Normal respiratory effort.  No retractions. Lungs CTAB. Gastrointestinal: Obese.  Soft with point tenderness to palpation of the left upper quadrant but no palpable organomegaly.  Negative Murphy's sign, no tenderness at McBurney's point, no rebound nor guarding Musculoskeletal: No lower extremity tenderness nor edema. No gross deformities of extremities. Neurologic:  Normal speech and language. No gross focal neurologic deficits are appreciated.  Skin:  Skin is warm, dry and intact. No rash noted. Psychiatric: Mood and affect are normal. Speech and behavior are normal.  ____________________________________________   LABS (all labs ordered are listed, but only abnormal results are displayed)  Labs Reviewed  COMPREHENSIVE METABOLIC PANEL - Abnormal; Notable for the following:       Result Value   Glucose, Bld 163 (*)    All other components within normal limits  CBC - Abnormal; Notable for the following:    WBC 14.3 (*)    RDW 15.1 (*)    All other components within normal limits  URINALYSIS, COMPLETE (UACMP) WITH MICROSCOPIC - Abnormal; Notable for the following:    Color, Urine YELLOW (*)    APPearance CLEAR (*)    All other components within normal limits  LIPASE, BLOOD  TROPONIN I   ____________________________________________  EKG  ED ECG REPORT I, Council Munguia, the attending physician, personally viewed and interpreted this ECG.  Date: 05/13/2017 EKG Time: 18:15 Rate: 84 Rhythm: normal sinus rhythm QRS Axis: normal Intervals: normal ST/T Wave abnormalities: normal Narrative Interpretation: unremarkable  ____________________________________________  RADIOLOGY   No results found.  ____________________________________________   PROCEDURES  Critical Care performed: No   Procedure(s)  performed:   Procedures   ____________________________________________   INITIAL IMPRESSION / ASSESSMENT AND PLAN / ED COURSE  Pertinent labs & imaging results that were available during my care of the patient were reviewed by me and considered in my medical decision making (see chart for details).  The patient is very well-appearing and in no acute distress.  He has point tenderness in the left upper quadrant but with no palpable organomegaly.  He has absolutely no other symptoms that I can identify.  He has a mild leukocytosis of uncertain significance but no other abnormal lab findings.  He is hemodynamically stable.  I had a long discussion with him about his differential including all the usual acute or emergent medical/surgical causes of abdominal pain and we discussed why I do not think they are present.  I explained that I believe his symptoms are most likely musculoskeletal and he is reassured by that.  I encouraged the use of ibuprofen and Tylenol and will also provide a prescription for Lidoderm patches that he can try if he would like to do so.  I encouraged him to follow up with his primary care doctor.  I gave my usual and customary return precautions.         ____________________________________________  FINAL CLINICAL IMPRESSION(S) / ED DIAGNOSES  Final diagnoses:  LUQ pain  Musculoskeletal pain     MEDICATIONS GIVEN DURING THIS VISIT:  Medications - No data to display   NEW OUTPATIENT MEDICATIONS STARTED DURING THIS VISIT:  New Prescriptions   LIDOCAINE (LIDODERM) 5 %    Place 1 patch onto the skin every 12 (twelve) hours. Remove & Discard patch within 12 hours or as directed by MD.  Wynelle Fanny the patch off for 12 hours before applying a new one.    Modified Medications   No medications on file    Discontinued Medications   No medications on file     Note:  This document was prepared using Dragon voice recognition software and may include unintentional  dictation errors.    Loleta Rose, MD 05/13/17 2144

## 2017-05-13 NOTE — ED Notes (Signed)
Pt ambulatory to exam room eating [employer] and drinking from a cup no distress noted

## 2017-05-13 NOTE — Discharge Instructions (Signed)
You have been seen in the Emergency Department (ED) for abdominal pain.  Your evaluation did not identify a clear cause of your symptoms but was generally reassuring.  We think your symptoms are most likely the result of musculoskeletal strain.  Please use over-the-counter ibuprofen and Tylenol according to label instructions as needed for pain.  You can also try using the prescribed Lidoderm patches to see if this helps.  Please follow up as instructed above regarding today?s emergent visit and the symptoms that are bothering you.  Return to the ED if your abdominal pain worsens or fails to improve, you develop bloody vomiting, bloody diarrhea, you are unable to tolerate fluids due to vomiting, fever greater than 101, or other symptoms that concern you.

## 2017-05-13 NOTE — ED Notes (Signed)
Pt reports pain to LUQ for 2 days denies any injury tender to touch, no bruising noted on assessment. Denies any pain if taking a deep breath. Pt talks in complete sentences no distress noted.

## 2017-09-12 ENCOUNTER — Emergency Department: Payer: Self-pay

## 2017-09-12 ENCOUNTER — Emergency Department
Admission: EM | Admit: 2017-09-12 | Discharge: 2017-09-12 | Disposition: A | Discharge status: Home / Self Care | Payer: Self-pay | Attending: Emergency Medicine | Admitting: Emergency Medicine

## 2017-09-12 ENCOUNTER — Encounter: Payer: Self-pay | Admitting: *Deleted

## 2017-09-12 DIAGNOSIS — Z5321 Procedure and treatment not carried out due to patient leaving prior to being seen by health care provider: Secondary | ICD-10-CM | POA: Insufficient documentation

## 2017-09-12 DIAGNOSIS — R0789 Other chest pain: Secondary | ICD-10-CM | POA: Insufficient documentation

## 2017-09-12 LAB — CBC
HEMATOCRIT: 41.7 % (ref 40.0–52.0)
HEMOGLOBIN: 14 g/dL (ref 13.0–18.0)
MCH: 26.4 pg (ref 26.0–34.0)
MCHC: 33.6 g/dL (ref 32.0–36.0)
MCV: 78.5 fL — ABNORMAL LOW (ref 80.0–100.0)
Platelets: 262 10*3/uL (ref 150–440)
RBC: 5.32 MIL/uL (ref 4.40–5.90)
RDW: 14.9 % — ABNORMAL HIGH (ref 11.5–14.5)
WBC: 12.8 10*3/uL — AB (ref 3.8–10.6)

## 2017-09-12 LAB — TROPONIN I

## 2017-09-12 LAB — BASIC METABOLIC PANEL
ANION GAP: 10 (ref 5–15)
BUN: 16 mg/dL (ref 6–20)
CALCIUM: 9.6 mg/dL (ref 8.9–10.3)
CO2: 25 mmol/L (ref 22–32)
Chloride: 102 mmol/L (ref 101–111)
Creatinine, Ser: 1.01 mg/dL (ref 0.61–1.24)
Glucose, Bld: 225 mg/dL — ABNORMAL HIGH (ref 65–99)
Potassium: 3.9 mmol/L (ref 3.5–5.1)
Sodium: 137 mmol/L (ref 135–145)

## 2017-09-12 NOTE — ED Notes (Addendum)
This RN and MD to check on pt and reevl after leaving critical pts room, pt not in room at this time. Pt unable to be found in lobby, hallways. Staff around questioned and deny seeing pt. Pt determined to be eloped.

## 2017-09-12 NOTE — ED Notes (Addendum)
EDP at bedside. Explained emergency critical pt OTW and MD Manson Passey will be with pt asap and terribly sorry for inconvenience. Pt explained his importance and as soon as pt stabilized MD will return but to please let staff know if needs arise prior to.

## 2017-09-12 NOTE — ED Triage Notes (Signed)
Pt complains of intermittent central chest pain starting today, pt denies any other symptoms

## 2018-03-18 ENCOUNTER — Other Ambulatory Visit: Payer: Self-pay

## 2018-03-18 ENCOUNTER — Emergency Department: Payer: Self-pay

## 2018-03-18 ENCOUNTER — Emergency Department
Admission: EM | Admit: 2018-03-18 | Discharge: 2018-03-18 | Disposition: A | Discharge status: Home / Self Care | Payer: Self-pay | Attending: Emergency Medicine | Admitting: Emergency Medicine

## 2018-03-18 DIAGNOSIS — J101 Influenza due to other identified influenza virus with other respiratory manifestations: Secondary | ICD-10-CM

## 2018-03-18 DIAGNOSIS — J111 Influenza due to unidentified influenza virus with other respiratory manifestations: Secondary | ICD-10-CM | POA: Insufficient documentation

## 2018-03-18 DIAGNOSIS — E119 Type 2 diabetes mellitus without complications: Secondary | ICD-10-CM | POA: Insufficient documentation

## 2018-03-18 LAB — INFLUENZA PANEL BY PCR (TYPE A & B)
INFLBPCR: NEGATIVE
Influenza A By PCR: POSITIVE — AB

## 2018-03-18 MED ORDER — HYDROCOD POLST-CPM POLST ER 10-8 MG/5ML PO SUER: 5.0000 mL | Freq: Two times a day (BID) | ORAL | 0 refills | Status: DC

## 2018-03-18 MED ORDER — ALBUTEROL SULFATE HFA 108 (90 BASE) MCG/ACT IN AERS: 2.0000 | INHALATION_SPRAY | Freq: Four times a day (QID) | RESPIRATORY_TRACT | 2 refills | Status: DC | PRN

## 2018-03-18 MED ORDER — IPRATROPIUM-ALBUTEROL 0.5-2.5 (3) MG/3ML IN SOLN
3.0000 mL | Freq: Once | RESPIRATORY_TRACT | Status: AC
  Administered 2018-03-18: 3 mL via RESPIRATORY_TRACT
  Filled 2018-03-18: qty 3

## 2018-03-18 NOTE — ED Notes (Signed)
Pt alert and oriented X4, active, cooperative, pt in NAD. RR even and unlabored, color WNL.  Pt informed to return if any life threatening symptoms occur.  Discharge and followup instructions reviewed.  

## 2018-03-18 NOTE — ED Triage Notes (Signed)
Pt in with co congestion, nonproductive cough, and body aches x 3 days.

## 2018-03-18 NOTE — ED Provider Notes (Signed)
[facility] Emergency Department Provider Note       Time seen: ----------------------------------------- 7:04 AM on 03/18/2018 -----------------------------------------   I have reviewed the triage vital signs and the nursing notes.  HISTORY   Chief Complaint Nasal Congestion    HPI Jerome Conner is a 47 y.o. male with a history of diabetes who presents to the ED for congestion, nonproductive cough and body aches for the past 3 days.  Patient states the reason he came to the ER today is due to wheezing, he has not had this happen before.  Wheezing and shortness of cough has made it difficult to sleep.  He does not think he has had a fever, denies vomiting or diarrhea.  Past Medical History:  Diagnosis Date  . Diabetes mellitus without complication (HCC)     There are no active problems to display for this patient.   No past surgical history on file.  Allergies Penicillins  Social History Social History   Tobacco Use  . Smoking status: Never Smoker  . Smokeless tobacco: Never Used  Substance Use Topics  . Alcohol use: No  . Drug use: No   Review of Systems Constitutional: Negative for fever.  Positive for body aches ENT: Positive for congestion Cardiovascular: Negative for chest pain. Respiratory: Positive for cough Gastrointestinal: Negative for abdominal pain, vomiting and diarrhea. Musculoskeletal: Positive for myalgias Skin: Negative for rash. Neurological: Negative for headaches, focal weakness or numbness.  All systems negative/normal/unremarkable except as stated in the HPI  ____________________________________________   PHYSICAL EXAM:  VITAL SIGNS: ED Triage Vitals [03/18/18 0526]  Enc Vitals Group     BP (!) 138/94     Pulse Rate (!) 103     Resp 20     Temp 99.6 F (37.6 C)     Temp Source Oral     SpO2 98 %     Weight 265 lb (120.2 kg)     Height 5\' 9"  (1.753 m)     Head Circumference      Peak Flow    Pain Score 0     Pain Loc      Pain Edu?      Excl. in GC?    Constitutional: Alert and oriented. Well appearing and in no distress. Eyes: Conjunctivae are normal. Normal extraocular movements. ENT   Head: Normocephalic and atraumatic.   Nose: Nasal passage congestion   Mouth/Throat: Mucous membranes are moist.   Neck: No stridor. Cardiovascular: Normal rate, regular rhythm. No murmurs, rubs, or gallops. Respiratory: Normal respiratory effort without tachypnea nor retractions. Breath sounds are clear and equal bilaterally.  Mild and expiratory wheezing Gastrointestinal: Soft and nontender. Normal bowel sounds Musculoskeletal: Nontender with normal range of motion in extremities. No lower extremity tenderness nor edema. Neurologic:  Normal speech and language. No gross focal neurologic deficits are appreciated.  Skin:  Skin is warm, dry and intact. No rash noted. Psychiatric: Mood and affect are normal. Speech and behavior are normal.  ____________________________________________  ED COURSE:  As part of my medical decision making, I reviewed the following data within the electronic MEDICAL RECORD NUMBER History obtained from family if available, nursing notes, old chart and ekg, as well as notes from prior ED visits. Patient presented for congestion and cough, we will assess with labs and imaging as indicated at this time.   Procedures ____________________________________________   LABS (pertinent positives/negatives)  Labs Reviewed  INFLUENZA PANEL BY PCR (TYPE A & B)   RADIOLOGY Images  were viewed by me  Chest x-ray IMPRESSION: No acute cardiopulmonary process seen.  ____________________________________________  DIFFERENTIAL DIAGNOSIS   URI, bronchitis, pneumonia, influenza  FINAL ASSESSMENT AND PLAN  Influenza   Plan: The patient had presented for congestion and cough. Patient's labs did reveal positive influenza a test. Patient's imaging was negative  for pneumonia.  He will be discharged with symptomatic treatment, I do not think Tamiflu would be beneficial.  He is cleared for outpatient follow-up.   Ulice Dash, MD   Note: This note was generated in part or whole with voice recognition software. Voice recognition is usually quite accurate but there are transcription errors that can and very often do occur. I apologize for any typographical errors that were not detected and corrected.     Emily Filbert, MD 03/18/18 [phone]

## 2018-03-18 NOTE — ED Notes (Addendum)
EKG entered erroneously; deleted in MUSE

## 2018-05-14 ENCOUNTER — Other Ambulatory Visit: Payer: Self-pay | Admitting: Family

## 2018-05-14 DIAGNOSIS — IMO0002 Reserved for concepts with insufficient information to code with codable children: Secondary | ICD-10-CM

## 2018-05-14 DIAGNOSIS — R229 Localized swelling, mass and lump, unspecified: Principal | ICD-10-CM

## 2018-05-20 ENCOUNTER — Ambulatory Visit
Admission: RE | Admit: 2018-05-20 | Discharge: 2018-05-20 | Disposition: A | Discharge status: Home / Self Care | Payer: Self-pay | Source: Ambulatory Visit | Attending: Family | Admitting: Family

## 2018-05-20 DIAGNOSIS — I861 Scrotal varices: Secondary | ICD-10-CM | POA: Insufficient documentation

## 2018-05-20 DIAGNOSIS — R229 Localized swelling, mass and lump, unspecified: Secondary | ICD-10-CM

## 2018-05-20 DIAGNOSIS — IMO0002 Reserved for concepts with insufficient information to code with codable children: Secondary | ICD-10-CM

## 2018-10-21 ENCOUNTER — Other Ambulatory Visit: Payer: Self-pay

## 2018-10-21 ENCOUNTER — Emergency Department
Admission: EM | Admit: 2018-10-21 | Discharge: 2018-10-21 | Disposition: A | Discharge status: Home / Self Care | Payer: Self-pay | Attending: Emergency Medicine | Admitting: Emergency Medicine

## 2018-10-21 DIAGNOSIS — E119 Type 2 diabetes mellitus without complications: Secondary | ICD-10-CM | POA: Insufficient documentation

## 2018-10-21 DIAGNOSIS — B349 Viral infection, unspecified: Secondary | ICD-10-CM | POA: Insufficient documentation

## 2018-10-21 DIAGNOSIS — R0981 Nasal congestion: Secondary | ICD-10-CM | POA: Insufficient documentation

## 2018-10-21 DIAGNOSIS — Z794 Long term (current) use of insulin: Secondary | ICD-10-CM | POA: Insufficient documentation

## 2018-10-21 DIAGNOSIS — H9203 Otalgia, bilateral: Secondary | ICD-10-CM | POA: Insufficient documentation

## 2018-10-21 DIAGNOSIS — Z79899 Other long term (current) drug therapy: Secondary | ICD-10-CM | POA: Insufficient documentation

## 2018-10-21 DIAGNOSIS — R42 Dizziness and giddiness: Secondary | ICD-10-CM | POA: Insufficient documentation

## 2018-10-21 LAB — COMPREHENSIVE METABOLIC PANEL
ALBUMIN: 4.1 g/dL (ref 3.5–5.0)
ALT: 19 U/L (ref 0–44)
ANION GAP: 9 (ref 5–15)
AST: 17 U/L (ref 15–41)
Alkaline Phosphatase: 58 U/L (ref 38–126)
BILIRUBIN TOTAL: 0.2 mg/dL — AB (ref 0.3–1.2)
BUN: 12 mg/dL (ref 6–20)
CO2: 26 mmol/L (ref 22–32)
Calcium: 9.4 mg/dL (ref 8.9–10.3)
Chloride: 101 mmol/L (ref 98–111)
Creatinine, Ser: 1.14 mg/dL (ref 0.61–1.24)
GFR calc Af Amer: >60 mL/min (ref >60)
GFR calc non Af Amer: >60 mL/min (ref >60)
GLUCOSE: 183 mg/dL — AB (ref 70–99)
POTASSIUM: 3.9 mmol/L (ref 3.5–5.1)
Sodium: 136 mmol/L (ref 135–145)
TOTAL PROTEIN: 8.2 g/dL — AB (ref 6.5–8.1)

## 2018-10-21 LAB — CBC
HCT: 43.8 % (ref 39.0–52.0)
HEMOGLOBIN: 14.1 g/dL (ref 13.0–17.0)
MCH: 25.4 pg — AB (ref 26.0–34.0)
MCHC: 32.2 g/dL (ref 30.0–36.0)
MCV: 78.8 fL — ABNORMAL LOW (ref 80.0–100.0)
PLATELETS: 280 10*3/uL (ref 150–400)
RBC: 5.56 MIL/uL (ref 4.22–5.81)
RDW: 14.3 % (ref 11.5–15.5)
WBC: 13.3 10*3/uL — AB (ref 4.0–10.5)
nRBC: 0 % (ref 0.0–0.2)

## 2018-10-21 LAB — URINALYSIS, COMPLETE (UACMP) WITH MICROSCOPIC
BILIRUBIN URINE: NEGATIVE
Bacteria, UA: NONE SEEN
GLUCOSE, UA: NEGATIVE mg/dL
Hgb urine dipstick: NEGATIVE
Ketones, ur: NEGATIVE mg/dL
Leukocytes, UA: NEGATIVE
NITRITE: NEGATIVE
PH: 5 (ref 5.0–8.0)
PROTEIN: NEGATIVE mg/dL
Specific Gravity, Urine: 1.019 (ref 1.005–1.030)

## 2018-10-21 LAB — LIPASE, BLOOD: Lipase: 35 U/L (ref 11–51)

## 2018-10-21 MED ORDER — MECLIZINE HCL 25 MG PO TABS: 25.0000 mg | ORAL_TABLET | Freq: Three times a day (TID) | ORAL | 0 refills | Status: DC | PRN

## 2018-10-21 NOTE — ED Notes (Signed)
Pt given ginger ale.

## 2018-10-21 NOTE — ED Triage Notes (Signed)
Pt arrives to ED via POV from home with c/o nausea and weakness since Monday. Pt denies vomiting, diarrhea or fever. No c/o CP or SHOB, no sore throat or otalgia. Pt reports taking OTC Nyquil without relief. Pt is A&O, in NAD; RR even, regular and unlabored.

## 2018-10-21 NOTE — ED Provider Notes (Signed)
[facility] Emergency Department Provider Note  ____________________________________________   First MD Initiated Contact with Patient 10/21/18 2158     (approximate)  I have reviewed the triage vital signs and the nursing notes.   HISTORY  Chief Complaint Emesis and Nausea   HPI Jerome Conner is a 47 y.o. male with a history of diabetes was presented emergency department today complaining of nausea as well as nasal congestion, sinus pressure as well as ear pressure, bilaterally.  He says that when he stood at this afternoon getting out of his truck he felt very dizzy and off-balance but it resolved spontaneously.  Denies fever.  Does not report chills.  Does not report any diarrhea or vomiting.  Able to eat a small meal from [employer] today without vomiting.  Does not report any known sick contacts.  Is concerned that he may have the flu which she had several years ago.  Past Medical History:  Diagnosis Date  . Diabetes mellitus without complication (HCC)     There are no active problems to display for this patient.   History reviewed. No pertinent surgical history.  Prior to Admission medications   Medication Sig Start Date End Date Taking? Authorizing Provider  albuterol (PROVENTIL HFA;VENTOLIN HFA) 108 (90 Base) MCG/ACT inhaler Inhale 2 puffs into the lungs every 6 (six) hours as needed for wheezing or shortness of breath. 03/18/18   Emily FilbertWilliams, Jonathan E, MD  chlorpheniramine-HYDROcodone (TUSSIONEX PENNKINETIC ER) 10-8 MG/5ML SUER Take 5 mLs by mouth 2 (two) times daily. 03/18/18   Emily FilbertWilliams, Jonathan E, MD  cyclobenzaprine (FLEXERIL) 10 MG tablet Take 1 tablet (10 mg total) by mouth every 8 (eight) hours as needed for muscle spasms. 10/16/15   Beers, Charmayne Sheerharles M, PA-C  glipiZIDE (GLUCOTROL) 10 MG tablet Take 10 mg by mouth daily before breakfast.    [provider]  HYDROcodone-acetaminophen (NORCO/VICODIN) 5-325 MG tablet Take 1 tablet by mouth  every 4 (four) hours as needed for moderate pain. 05/08/16   Cuthriell, Delorise RoyalsJonathan D, PA-C  ibuprofen (ADVIL,MOTRIN) 800 MG tablet Take 1 tablet (800 mg total) by mouth every 8 (eight) hours as needed. 10/16/15   Beers, Charmayne Sheerharles M, PA-C  insulin glargine (LANTUS) 100 UNIT/ML injection Inject 15 Units into the skin at bedtime.    [provider]  LISINOPRIL PO Take 1 tablet by mouth daily.    [provider]  meloxicam (MOBIC) 15 MG tablet Take 1 tablet (15 mg total) by mouth daily. 05/08/16   Cuthriell, Delorise RoyalsJonathan D, PA-C  metFORMIN (GLUCOPHAGE) 500 MG tablet Take 500 mg by mouth 2 (two) times daily with a meal.    [provider]  sulfacetamide (BLEPH-10) 10 % ophthalmic solution Place 2 drops into both eyes 4 (four) times daily. 10/16/15   Beers, Charmayne Sheerharles M, PA-C    Allergies Penicillins  No family history on file.  Social History Social History   Tobacco Use  . Smoking status: Never Smoker  . Smokeless tobacco: Never Used  Substance Use Topics  . Alcohol use: No  . Drug use: No    Review of Systems  Constitutional: No fever/chills Eyes: No visual changes.  Says that eyes are "itchy." ENT: No sore throat. Cardiovascular: Denies chest pain. Respiratory: Mild nonproductive cough Gastrointestinal: No abdominal pain.   no vomiting.  No diarrhea.  No constipation. Genitourinary: Negative for dysuria. Musculoskeletal: Negative for back pain. Skin: Negative for rash. Neurological: Negative for headaches, focal weakness or numbness.   ____________________________________________  PHYSICAL EXAM:  VITAL SIGNS: ED Triage Vitals [10/21/18 2137]  Enc Vitals Group     BP (!) 150/92     Pulse Rate 91     Resp 18     Temp 99.1 F (37.3 C)     Temp Source Oral     SpO2 99 %     Weight 270 lb (122.5 kg)     Height 5\' 9"  (1.753 m)     Head Circumference      Peak Flow      Pain Score 0     Pain Loc      Pain Edu?      Excl. in GC?     Constitutional:  Alert and oriented. Well appearing and in no acute distress. Eyes: Conjunctivae are normal.  Head: Atraumatic.  Normal TMs bilaterally. Nose: Mild amount of clear rhinorrhea bilaterally. Mouth/Throat: Mucous membranes are moist.  No tonsillar exudate.  No pharyngeal swelling nor redness. Neck: No stridor.   Cardiovascular: Normal rate, regular rhythm. Grossly normal heart sounds.   Respiratory: Normal respiratory effort.  No retractions. Lungs CTAB. Gastrointestinal: Soft and nontender. No distention.  Musculoskeletal: No lower extremity tenderness nor edema.  No joint effusions. Neurologic:  Normal speech and language. No gross focal neurologic deficits are appreciated.  Patient able to walk with a normal gait unassisted.  Not ataxic.  No ataxia on finger-nose testing. Skin:  Skin is warm, dry and intact. No rash noted. Psychiatric: Mood and affect are normal. Speech and behavior are normal.  ____________________________________________   LABS (all labs ordered are listed, but only abnormal results are displayed)  Labs Reviewed  COMPREHENSIVE METABOLIC PANEL - Abnormal; Notable for the following components:      Result Value   Glucose, Bld 183 (*)    Total Protein 8.2 (*)    Total Bilirubin 0.2 (*)    All other components within normal limits  CBC - Abnormal; Notable for the following components:   WBC 13.3 (*)    MCV 78.8 (*)    MCH 25.4 (*)    All other components within normal limits  URINALYSIS, COMPLETE (UACMP) WITH MICROSCOPIC - Abnormal; Notable for the following components:   Color, Urine YELLOW (*)    APPearance CLEAR (*)    All other components within normal limits  LIPASE, BLOOD   ____________________________________________  EKG   ____________________________________________  RADIOLOGY   ____________________________________________   PROCEDURES  Procedure(s) performed:   Procedures  Critical Care performed:    ____________________________________________   INITIAL IMPRESSION / ASSESSMENT AND PLAN / ED COURSE  Pertinent labs & imaging results that were available during my care of the patient were reviewed by me and considered in my medical decision making (see chart for details).  DDX: URI, viral syndrome, influenza, peripheral vertigo, labyrinthitis, sinus infection As part of my medical decision making, I reviewed the following data within the electronic MEDICAL RECORD NUMBER Notes from prior ED visits  Patient advised to try over-the-counter medications including NyQuil for itching eyes as well as cough and cold symptoms.  Offered nausea medication but patient does not want this.  We discussed dietary changes that he could try while he is still feeling nauseous.  Will be prescribed meclizine for dizziness as needed but I do not see any signs of central vertigo.  Patient will be discharged at this time.  No fever.  Unlikely to be flu but out of the range for Tamiflu anyway as the patient has been having symptoms  for approximately 5 to 6 days.  He is understanding of the diagnosis as well as treatment and willing to comply. ____________________________________________   FINAL CLINICAL IMPRESSION(S) / ED DIAGNOSES  Viral syndrome vertigo.  NEW MEDICATIONS STARTED DURING THIS VISIT:  New Prescriptions   No medications on file     Note:  This document was prepared using Dragon voice recognition software and may include unintentional dictation errors.     Myrna Blazer, MD 10/21/18 2214

## 2019-04-01 ENCOUNTER — Ambulatory Visit (INDEPENDENT_AMBULATORY_CARE_PROVIDER_SITE_OTHER): Payer: Self-pay | Admitting: Podiatry

## 2019-04-01 ENCOUNTER — Other Ambulatory Visit: Payer: Self-pay

## 2019-04-01 ENCOUNTER — Encounter: Payer: Self-pay | Admitting: Podiatry

## 2019-04-01 VITALS — Temp 97.5°F

## 2019-04-01 DIAGNOSIS — M2141 Flat foot [pes planus] (acquired), right foot: Secondary | ICD-10-CM

## 2019-04-01 DIAGNOSIS — M2142 Flat foot [pes planus] (acquired), left foot: Secondary | ICD-10-CM

## 2019-04-01 DIAGNOSIS — E119 Type 2 diabetes mellitus without complications: Secondary | ICD-10-CM

## 2019-04-01 DIAGNOSIS — M722 Plantar fascial fibromatosis: Secondary | ICD-10-CM

## 2019-04-01 MED ORDER — MELOXICAM 15 MG PO TABS: 15.0000 mg | ORAL_TABLET | Freq: Every day | ORAL | 1 refills | Status: DC

## 2019-04-01 NOTE — Progress Notes (Signed)
This diabetic patient presents the office with chief complaint of redness and pain noted in his arch extending into his big toe both feet.  He says that this is been going on for weeks and it feels like he is walking on pebbles on his left foot greater than his right foot.  He says he has self treated himself with ice packs which have helped but the problem persists.  This patient is a diabetic for the last 8 years.  Patient presents the office today for an evaluation of his feet for his diabetes and the pain in both feet through his arches.   Vascular  Dorsalis pedis and posterior tibial pulses are palpable  B/L.  Capillary return  WNL.  Temperature gradient is  WNL.  Skin turgor  WNL  Sensorium  Senn Weinstein monofilament wire  WNL. Normal tactile sensation.  Nail Exam  Patient has normal nails with no evidence of bacterial or fungal infection.  Orthopedic  Exam  Muscle tone and muscle strength  WNL.  No limitations of motion feet  B/L.  No crepitus or joint effusion noted.  Foot type is unremarkable and digits show no abnormalities.  Bony prominences are unremarkable. Pain extending from his medial plantar fascia  Into his big toe  B/L. Flexible pes planus.  Skin  No open lesions.  Normal skin texture and turgor.  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  Diabetes with no complication.  Diabetic foot exam.  Powerstep insoles were prescribed.  Prescribe Mobic po for pain.  RTC 4 weeks.   Helane Gunther DPM

## 2019-04-22 ENCOUNTER — Ambulatory Visit: Payer: Self-pay | Admitting: Podiatry

## 2019-05-03 ENCOUNTER — Other Ambulatory Visit: Payer: Self-pay

## 2019-05-03 ENCOUNTER — Ambulatory Visit (INDEPENDENT_AMBULATORY_CARE_PROVIDER_SITE_OTHER): Payer: Self-pay

## 2019-05-03 ENCOUNTER — Ambulatory Visit (INDEPENDENT_AMBULATORY_CARE_PROVIDER_SITE_OTHER): Payer: Self-pay | Admitting: Podiatry

## 2019-05-03 ENCOUNTER — Encounter: Payer: Self-pay | Admitting: Podiatry

## 2019-05-03 ENCOUNTER — Other Ambulatory Visit: Payer: Self-pay | Admitting: Podiatry

## 2019-05-03 VITALS — Temp 97.3°F

## 2019-05-03 DIAGNOSIS — E119 Type 2 diabetes mellitus without complications: Secondary | ICD-10-CM

## 2019-05-03 DIAGNOSIS — M722 Plantar fascial fibromatosis: Secondary | ICD-10-CM

## 2019-05-03 DIAGNOSIS — M2142 Flat foot [pes planus] (acquired), left foot: Secondary | ICD-10-CM

## 2019-05-03 DIAGNOSIS — M779 Enthesopathy, unspecified: Secondary | ICD-10-CM

## 2019-05-03 DIAGNOSIS — M2141 Flat foot [pes planus] (acquired), right foot: Secondary | ICD-10-CM

## 2019-05-03 NOTE — Progress Notes (Signed)
This patient returns to the office follow-up for diagnosis of plantar fasciitis secondary to pes planus.  Patient was treated with power step insoles to be worn for his pes planus and to take Mobic for his foot pain.  He says that his feet are still painful despite the fact he has purchased new pair of shoes with insoles and taken the Mobic.  Patient does state that the shoes are very tight with the power steps in them.  He states today he is also experiencing pain in the ball of both feet with the left more painful than the right.  Patient presents the office today for continued evaluation and treatment of both feet.  Vascular  Dorsalis pedis and posterior tibial pulses are palpable  B/L.  Capillary return  WNL.  Temperature gradient is  WNL.  Skin turgor  WNL  Sensorium  Senn Weinstein monofilament wire  WNL. Normal tactile sensation.  Nail Exam  Patient has normal nails with no evidence of bacterial or fungal infection.  Orthopedic  Exam  Muscle tone and muscle strength  WNL.  No limitations of motion feet  B/L.  No crepitus or joint effusion noted.  Foot type is unremarkable and digits show no abnormalities.  Bony prominences are unremarkable. Flexible pes planus.  Palpable pain fibular sesamoid left foot and palpable pain tibial sesamoid right foot..    Skin  No open lesions.  Normal skin texture and turgor   .Sesamoiditis  B/L  Pes planus  B/l.  Plantar fasciitis  B/l.  ROV.  X-rays taken of both feet revealed no evidence of bony pathology in the forefeet bilateral.  Patient does have calcification at the insertion of the Achilles tendon bilateral.  Discussed this condition with this patient examined his shoes and power step insoles and found that he had neglected to remove the inner insole from the new pair of shoes.  Once they were removed he said his feet fit better in the shoe.  Patient was not interested in injection therapy today so I applied a dispersion padding sub-first MPJ right and  left foot.  Upon leaving the office he said he is experiencing no pain and discomfort in the big toe joint both feet.  Patient was told to return to the office in 3 weeks for further evaluation and treatment.  Patient was also told to continue taking Mobic p.o..   Helane Gunther DPM

## 2019-05-24 ENCOUNTER — Ambulatory Visit: Payer: Self-pay | Admitting: Podiatry

## 2019-08-02 ENCOUNTER — Ambulatory Visit: Payer: Self-pay | Admitting: Podiatry

## 2019-12-29 ENCOUNTER — Emergency Department
Admission: EM | Admit: 2019-12-29 | Discharge: 2019-12-29 | Disposition: A | Discharge status: Home / Self Care | Payer: No Typology Code available for payment source | Attending: Student | Admitting: Student

## 2019-12-29 ENCOUNTER — Emergency Department: Payer: No Typology Code available for payment source

## 2019-12-29 ENCOUNTER — Encounter: Payer: Self-pay | Admitting: Intensive Care

## 2019-12-29 ENCOUNTER — Other Ambulatory Visit: Payer: Self-pay

## 2019-12-29 DIAGNOSIS — Z7982 Long term (current) use of aspirin: Secondary | ICD-10-CM | POA: Diagnosis not present

## 2019-12-29 DIAGNOSIS — Z7984 Long term (current) use of oral hypoglycemic drugs: Secondary | ICD-10-CM | POA: Diagnosis not present

## 2019-12-29 DIAGNOSIS — Z79899 Other long term (current) drug therapy: Secondary | ICD-10-CM | POA: Diagnosis not present

## 2019-12-29 DIAGNOSIS — E119 Type 2 diabetes mellitus without complications: Secondary | ICD-10-CM | POA: Insufficient documentation

## 2019-12-29 DIAGNOSIS — R82998 Other abnormal findings in urine: Secondary | ICD-10-CM | POA: Diagnosis not present

## 2019-12-29 DIAGNOSIS — R319 Hematuria, unspecified: Secondary | ICD-10-CM | POA: Diagnosis present

## 2019-12-29 LAB — CBC WITH DIFFERENTIAL/PLATELET
Abs Immature Granulocytes: 0.05 10*3/uL (ref 0.00–0.07)
Basophils Absolute: 0 10*3/uL (ref 0.0–0.1)
Basophils Relative: 0 %
Eosinophils Absolute: 0.1 10*3/uL (ref 0.0–0.5)
Eosinophils Relative: 1 %
HCT: 43.1 % (ref 39.0–52.0)
Hemoglobin: 14 g/dL (ref 13.0–17.0)
Immature Granulocytes: 0 %
Lymphocytes Relative: 38 %
Lymphs Abs: 5.4 10*3/uL — ABNORMAL HIGH (ref 0.7–4.0)
MCH: 25.9 pg — ABNORMAL LOW (ref 26.0–34.0)
MCHC: 32.5 g/dL (ref 30.0–36.0)
MCV: 79.7 fL — ABNORMAL LOW (ref 80.0–100.0)
Monocytes Absolute: 1.1 10*3/uL — ABNORMAL HIGH (ref 0.1–1.0)
Monocytes Relative: 8 %
Neutro Abs: 7.7 10*3/uL (ref 1.7–7.7)
Neutrophils Relative %: 53 %
Platelets: 257 10*3/uL (ref 150–400)
RBC: 5.41 MIL/uL (ref 4.22–5.81)
RDW: 14.5 % (ref 11.5–15.5)
WBC: 14.5 10*3/uL — ABNORMAL HIGH (ref 4.0–10.5)
nRBC: 0 % (ref 0.0–0.2)

## 2019-12-29 LAB — URINALYSIS, COMPLETE (UACMP) WITH MICROSCOPIC
Bacteria, UA: NONE SEEN
Bilirubin Urine: NEGATIVE
Glucose, UA: NEGATIVE mg/dL
Hgb urine dipstick: NEGATIVE
Ketones, ur: NEGATIVE mg/dL
Leukocytes,Ua: NEGATIVE
Nitrite: NEGATIVE
Protein, ur: NEGATIVE mg/dL
Specific Gravity, Urine: 1.025 (ref 1.005–1.030)
pH: 5 (ref 5.0–8.0)

## 2019-12-29 LAB — BASIC METABOLIC PANEL
Anion gap: 11 (ref 5–15)
BUN: 15 mg/dL (ref 6–20)
CO2: 25 mmol/L (ref 22–32)
Calcium: 9.5 mg/dL (ref 8.9–10.3)
Chloride: 102 mmol/L (ref 98–111)
Creatinine, Ser: 1.24 mg/dL (ref 0.61–1.24)
GFR calc Af Amer: >60 mL/min (ref >60)
GFR calc non Af Amer: >60 mL/min (ref >60)
Glucose, Bld: 163 mg/dL — ABNORMAL HIGH (ref 70–99)
Potassium: 4.5 mmol/L (ref 3.5–5.1)
Sodium: 138 mmol/L (ref 135–145)

## 2019-12-29 LAB — CK: Total CK: 151 U/L (ref 49–397)

## 2019-12-29 NOTE — Discharge Instructions (Signed)
Thank you for letting us take care of you in the emergency department today. Your work up today did not show any evidence of kidney stones or urine infection.   Congratulations on doing so well on your health care journey, keep up the good work!!  Please continue to take any regular, prescribed medications.   Please return to the ER for any new or worsening symptoms.

## 2019-12-29 NOTE — ED Triage Notes (Signed)
FIRST NURSE NOTE- here for hematuria. NAD. Ambulatory.

## 2019-12-29 NOTE — ED Provider Notes (Signed)
[facility] Emergency Department Provider Note  ____________________________________________   First MD Initiated Contact with Patient 12/29/19 2043     (approximate)  I have reviewed the triage vital signs and the nursing notes.  History  Chief Complaint Hematuria    HPI Jerome Conner is a 49 y.o. male with hx of DM2 who presents to the ED for painless hematuria. Patient first noticed red colored this afternoon, describes urine as red/bloody appearing. No blood clots. No pain or dysuria. No malodorous urine. No hx of the same. No flank pain or hx of kidney stones. He does note that he has been trying to be healthier recently and has been working out more, drinking tea, and well as beet juice. Does not smoke. No hx of kidney or bladder disease. Denies any penile discharge or concern for STD exposure. Denies any trauma.    Past Medical Hx Past Medical History:  Diagnosis Date  . Diabetes mellitus without complication [facility])     Problem List Patient Active Problem List   Diagnosis Date Noted  . Type 2 diabetes mellitus ([place]) 10/03/2016    Past Surgical Hx History reviewed. No pertinent surgical history.  Medications Prior to Admission medications   Medication Sig Start Date End Date Taking? Authorizing Provider  aspirin EC 81 MG tablet Take 81 mg by mouth daily.    [provider]  losartan (COZAAR) 25 MG tablet Take 25 mg by mouth daily. Take 1/2 tab daily    [provider]  meloxicam (MOBIC) 15 MG tablet Take 1 tablet (15 mg total) by mouth daily. 04/01/19   Gardiner Barefoot, DPM  sildenafil (REVATIO) 20 MG tablet sildenafil (pulmonary hypertension) 20 mg tablet  TAKE 1 TABLET BY MOUTH EVERY DAY    [provider]  sitaGLIPtin-metformin (JANUMET) 50-1000 MG tablet Take 1 tablet by mouth 2 (two) times daily with a meal.    [provider]    Allergies Penicillin g and Penicillins  Family Hx History  reviewed. No pertinent family history.  Social Hx Social History   Tobacco Use  . Smoking status: Never Smoker  . Smokeless tobacco: Never Used  Substance Use Topics  . Alcohol use: Yes    Alcohol/week: 1.0 standard drinks    Types: 1 Glasses of wine per week  . Drug use: No     Review of Systems  Constitutional: Negative for fever, chills. Eyes: Negative for visual changes. ENT: Negative for sore throat. Cardiovascular: Negative for chest pain. Respiratory: Negative for shortness of breath. Gastrointestinal: Negative for nausea, vomiting.  Genitourinary: + hematuria Musculoskeletal: Negative for leg swelling. Skin: Negative for rash. Neurological: Negative for headaches.   Physical Exam  Vital Signs: ED Triage Vitals  Enc Vitals Group     BP 12/29/19 1842 (!) 158/91     Pulse Rate 12/29/19 1842 (!) 106     Resp 12/29/19 1842 16     Temp 12/29/19 1842 98.9 F (37.2 C)     Temp Source 12/29/19 1842 Oral     SpO2 12/29/19 1842 97 %     Weight 12/29/19 1843 256 lb (116.1 kg)     Height 12/29/19 1843 5\' 9"  (1.753 m)     Head Circumference --      Peak Flow --      Pain Score 12/29/19 1843 0     Pain Loc --      Pain Edu? --      Excl. in [place]? --  Constitutional: Alert and oriented.  Head: Normocephalic. Atraumatic. Eyes: Conjunctivae clear. Sclera anicteric. Nose: No congestion. No rhinorrhea. Mouth/Throat: Wearing mask.  Neck: No stridor.   Cardiovascular: Normal rate, regular rhythm. Extremities well perfused. Respiratory: Normal respiratory effort.  Lungs CTAB. Gastrointestinal: Soft. Non-tender. Non-distended.  Musculoskeletal: No lower extremity edema. No deformities. Neurologic:  Normal speech and language. No gross focal neurologic deficits are appreciated.  Skin: Skin is warm, dry and intact. No rash noted. Psychiatric: Mood and affect are appropriate for situation.   Radiology  CT: IMPRESSION:  Fatty infiltration of the liver.  No renal or  ureteral stones. No hydronephrosis.  Prostate enlargement.  No acute findings.    Procedures  Procedure(s) performed (including critical care):  Procedures   Initial Impression / Assessment and Plan / ED Course  49 y.o. male who presents to the ED for painless red-colored urine.   Ddx: UTI, cystitis, nephrolithiasis, related to beet juice ingestion, consider rhabdo given increased exercise regimen though less likely given no associate pain or muscle aches  Obtain labs, imaging, urine studies.  CK within normal limits, therefore unlikely to be rhabdo and in fact UA w/o blood, Hgb or RBCs -  higher suspicion for beet juice ingestion related. No evidence of urine infection. CT negative.  Suspect presentation likely related to his beet juice drinking.  Updated patient on results. Advised supportive care, continued healthy diet and exercise, follow-up with PCP as needed.  Patient voices understanding and is comfortable to plan and discharge.   Final Clinical Impression(s) / ED Diagnosis  Final diagnoses:  Red-colored urine       Note:  This document was prepared using Dragon voice recognition software and may include unintentional dictation errors.   Miguel Aschoff., MD 12/29/19 2201

## 2019-12-29 NOTE — ED Notes (Signed)
Pt reports hematuria that started this afternoon - Denies dysuria - Denies any trauma to the area

## 2019-12-29 NOTE — ED Triage Notes (Addendum)
Patient c/o hematuria that started around 6:20pm today. Denies pain. Denies urinary symptoms.

## 2020-01-23 ENCOUNTER — Emergency Department
Admission: EM | Admit: 2020-01-23 | Discharge: 2020-01-23 | Disposition: A | Discharge status: Home / Self Care | Payer: Managed Care, Other (non HMO) | Attending: Emergency Medicine | Admitting: Emergency Medicine

## 2020-01-23 ENCOUNTER — Emergency Department: Payer: Managed Care, Other (non HMO)

## 2020-01-23 ENCOUNTER — Other Ambulatory Visit: Payer: Self-pay

## 2020-01-23 ENCOUNTER — Encounter: Payer: Self-pay | Admitting: Emergency Medicine

## 2020-01-23 DIAGNOSIS — R1013 Epigastric pain: Secondary | ICD-10-CM | POA: Diagnosis not present

## 2020-01-23 DIAGNOSIS — K859 Acute pancreatitis without necrosis or infection, unspecified: Secondary | ICD-10-CM | POA: Diagnosis not present

## 2020-01-23 DIAGNOSIS — Z7982 Long term (current) use of aspirin: Secondary | ICD-10-CM | POA: Insufficient documentation

## 2020-01-23 DIAGNOSIS — Z7984 Long term (current) use of oral hypoglycemic drugs: Secondary | ICD-10-CM | POA: Diagnosis not present

## 2020-01-23 DIAGNOSIS — R1011 Right upper quadrant pain: Secondary | ICD-10-CM | POA: Diagnosis not present

## 2020-01-23 DIAGNOSIS — E119 Type 2 diabetes mellitus without complications: Secondary | ICD-10-CM | POA: Diagnosis not present

## 2020-01-23 LAB — URINALYSIS, COMPLETE (UACMP) WITH MICROSCOPIC
Bilirubin Urine: NEGATIVE
Glucose, UA: 50 mg/dL — AB
Hgb urine dipstick: NEGATIVE
Ketones, ur: 5 mg/dL — AB
Leukocytes,Ua: NEGATIVE
Nitrite: NEGATIVE
Protein, ur: 30 mg/dL — AB
Specific Gravity, Urine: 1.029 (ref 1.005–1.030)
pH: 5 (ref 5.0–8.0)

## 2020-01-23 LAB — CBC WITH DIFFERENTIAL/PLATELET
Abs Immature Granulocytes: 0.04 10*3/uL (ref 0.00–0.07)
Basophils Absolute: 0 10*3/uL (ref 0.0–0.1)
Basophils Relative: 0 %
Eosinophils Absolute: 0.2 10*3/uL (ref 0.0–0.5)
Eosinophils Relative: 1 %
HCT: 43.6 % (ref 39.0–52.0)
Hemoglobin: 14.3 g/dL (ref 13.0–17.0)
Immature Granulocytes: 0 %
Lymphocytes Relative: 34 %
Lymphs Abs: 4.7 10*3/uL — ABNORMAL HIGH (ref 0.7–4.0)
MCH: 25.9 pg — ABNORMAL LOW (ref 26.0–34.0)
MCHC: 32.8 g/dL (ref 30.0–36.0)
MCV: 79 fL — ABNORMAL LOW (ref 80.0–100.0)
Monocytes Absolute: 1 10*3/uL (ref 0.1–1.0)
Monocytes Relative: 7 %
Neutro Abs: 7.8 10*3/uL — ABNORMAL HIGH (ref 1.7–7.7)
Neutrophils Relative %: 58 %
Platelets: 305 10*3/uL (ref 150–400)
RBC: 5.52 MIL/uL (ref 4.22–5.81)
RDW: 13.9 % (ref 11.5–15.5)
WBC: 13.6 10*3/uL — ABNORMAL HIGH (ref 4.0–10.5)
nRBC: 0 % (ref 0.0–0.2)

## 2020-01-23 LAB — COMPREHENSIVE METABOLIC PANEL
ALT: 24 U/L (ref 0–44)
AST: 14 U/L — ABNORMAL LOW (ref 15–41)
Albumin: 3.8 g/dL (ref 3.5–5.0)
Alkaline Phosphatase: 64 U/L (ref 38–126)
Anion gap: 8 (ref 5–15)
BUN: 13 mg/dL (ref 6–20)
CO2: 29 mmol/L (ref 22–32)
Calcium: 9.4 mg/dL (ref 8.9–10.3)
Chloride: 100 mmol/L (ref 98–111)
Creatinine, Ser: 1.16 mg/dL (ref 0.61–1.24)
GFR calc Af Amer: >60 mL/min (ref >60)
GFR calc non Af Amer: >60 mL/min (ref >60)
Glucose, Bld: 239 mg/dL — ABNORMAL HIGH (ref 70–99)
Potassium: 4 mmol/L (ref 3.5–5.1)
Sodium: 137 mmol/L (ref 135–145)
Total Bilirubin: 0.7 mg/dL (ref 0.3–1.2)
Total Protein: 8.9 g/dL — ABNORMAL HIGH (ref 6.5–8.1)

## 2020-01-23 LAB — LIPASE, BLOOD: Lipase: 89 U/L — ABNORMAL HIGH (ref 11–51)

## 2020-01-23 MED ORDER — ALUM & MAG HYDROXIDE-SIMETH 200-200-20 MG/5ML PO SUSP
30.0000 mL | Freq: Once | ORAL | Status: AC
  Administered 2020-01-23: 30 mL via ORAL
  Filled 2020-01-23: qty 30

## 2020-01-23 MED ORDER — PANTOPRAZOLE SODIUM 40 MG PO TBEC
40.0000 mg | DELAYED_RELEASE_TABLET | Freq: Once | ORAL | Status: AC
  Administered 2020-01-23: 06:00:00 40 mg via ORAL
  Filled 2020-01-23: qty 1

## 2020-01-23 MED ORDER — SODIUM CHLORIDE 0.9% FLUSH: 3.0000 mL | Freq: Once | INTRAVENOUS | Status: DC

## 2020-01-23 MED ORDER — LIDOCAINE VISCOUS HCL 2 % MT SOLN
15.0000 mL | Freq: Once | OROMUCOSAL | Status: AC
  Administered 2020-01-23: 05:00:00 15 mL via ORAL
  Filled 2020-01-23: qty 15

## 2020-01-23 MED ORDER — PANTOPRAZOLE SODIUM 40 MG PO TBEC: 40.0000 mg | DELAYED_RELEASE_TABLET | Freq: Every day | ORAL | 1 refills | Status: AC

## 2020-01-23 NOTE — ED Provider Notes (Signed)
[facility] Emergency Department Provider Note  ____________________________________________   First MD Initiated Contact with Patient 01/23/20 [phone]     (approximate)  I have reviewed the triage vital signs and the nursing notes.   HISTORY  Chief Complaint Abdominal Pain    HPI Jerome Conner is a 49 y.o. male with history of diabetes presents to the emergency department secondary to current 5 out of 10 intermittent burning epigastric pain for the past 6 days.  Patient states that the pain is worse after eating.  Patient denies any nausea or vomiting.  Patient denies any diarrhea.  Patient denies any urinary symptoms.        Past Medical History:  Diagnosis Date  . Diabetes mellitus without complication [facility])     Patient Active Problem List   Diagnosis Date Noted  . Type 2 diabetes mellitus ([place]) 10/03/2016    History reviewed. No pertinent surgical history.  Prior to Admission medications   Medication Sig Start Date End Date Taking? Authorizing Provider  aspirin EC 81 MG tablet Take 81 mg by mouth daily.    [provider]  losartan (COZAAR) 25 MG tablet Take 25 mg by mouth daily. Take 1/2 tab daily    [provider]  meloxicam (MOBIC) 15 MG tablet Take 1 tablet (15 mg total) by mouth daily. 04/01/19   Gardiner Barefoot, DPM  sildenafil (REVATIO) 20 MG tablet sildenafil (pulmonary hypertension) 20 mg tablet  TAKE 1 TABLET BY MOUTH EVERY DAY    [provider]  sitaGLIPtin-metformin (JANUMET) 50-1000 MG tablet Take 1 tablet by mouth 2 (two) times daily with a meal.    [provider]    Allergies Penicillin g and Penicillins  History reviewed. No pertinent family history.  Social History Social History   Tobacco Use  . Smoking status: Never Smoker  . Smokeless tobacco: Never Used  Substance Use Topics  . Alcohol use: Yes    Alcohol/week: 1.0 standard drinks    Types: 1 Glasses of wine per week  .  Drug use: No    Review of Systems Constitutional: No fever/chills Eyes: No visual changes. ENT: No sore throat. Cardiovascular: Denies chest pain. Respiratory: Denies shortness of breath. Gastrointestinal: Positive for abdominal pain.  No nausea, no vomiting.  No diarrhea.  No constipation. Genitourinary: Negative for dysuria. Musculoskeletal: Negative for neck pain.  Negative for back pain. Integumentary: Negative for rash. Neurological: Negative for headaches, focal weakness or numbness.   ____________________________________________   PHYSICAL EXAM:  VITAL SIGNS: ED Triage Vitals  Enc Vitals Group     BP 01/23/20 0451 (!) 143/89     Pulse Rate 01/23/20 0451 (!) 104     Resp 01/23/20 0451 18     Temp 01/23/20 0451 98.7 F (37.1 C)     Temp Source 01/23/20 0451 Oral     SpO2 01/23/20 0451 100 %     Weight 01/23/20 0452 115.7 kg (255 lb)     Height 01/23/20 0452 1.753 m (5\' 9" )     Head Circumference --      Peak Flow --      Pain Score 01/23/20 0452 6     Pain Loc --      Pain Edu? --      Excl. in [place]? --     Constitutional: Alert and oriented.  Eyes: Conjunctivae are normal.  Head: Atraumatic. Mouth/Throat: Patient is wearing a mask. Neck: No stridor.  No meningeal signs.   Cardiovascular:  Normal rate, regular rhythm. Good peripheral circulation. Grossly normal heart sounds. Respiratory: Normal respiratory effort.  No retractions. Gastrointestinal: Soft and nontender. No distention.  Musculoskeletal: No lower extremity tenderness nor edema. No gross deformities of extremities. Neurologic:  Normal speech and language. No gross focal neurologic deficits are appreciated.  Skin:  Skin is warm, dry and intact. Psychiatric: Mood and affect are normal. Speech and behavior are normal.  ____________________________________________   LABS (all labs ordered are listed, but only abnormal results are displayed)  Labs Reviewed  CBC WITH DIFFERENTIAL/PLATELET -  Abnormal; Notable for the following components:      Result Value   WBC 13.6 (*)    MCV 79.0 (*)    MCH 25.9 (*)    Neutro Abs 7.8 (*)    Lymphs Abs 4.7 (*)    All other components within normal limits  COMPREHENSIVE METABOLIC PANEL - Abnormal; Notable for the following components:   Glucose, Bld 239 (*)    Total Protein 8.9 (*)    AST 14 (*)    All other components within normal limits  LIPASE, BLOOD - Abnormal; Notable for the following components:   Lipase 89 (*)    All other components within normal limits  URINALYSIS, COMPLETE (UACMP) WITH MICROSCOPIC   ____________________________________________  EKG  ED ECG REPORT I, Mamers N Natasha Burda, the attending physician, personally viewed and interpreted this ECG.   Date: 01/23/2020  EKG Time: 4:52 AM  Rate: 90  Rhythm: Normal sinus rhythm  Axis: Normal  Intervals: Normal  ST&T Change: None  ____________________________________________  RADIOLOGY I, [place] N Waddell Iten, personally viewed and evaluated these images (plain radiographs) as part of my medical decision making, as well as reviewing the written report by the radiologist.  ED MD interpretation: Ultrasound revealed normal gallbladder no cholelithiasis no biliary dilatation diffuse hepatic steatosis  Official radiology report(s): US ABDOMEN LIMITED RUQ  Result Date: 01/23/2020 CLINICAL DATA:  Right upper quadrant abdominal pain for 6 days EXAM: ULTRASOUND ABDOMEN LIMITED RIGHT UPPER QUADRANT COMPARISON:  Unenhanced CT abdomen/pelvis from 12/28/2018 FINDINGS: Gallbladder: No gallstones or wall thickening visualized. No sonographic Murphy sign noted by sonographer. Common bile duct: Diameter: 2 mm Liver: Markedly echogenic liver parenchyma with posterior acoustic attenuation, compatible with diffuse hepatic steatosis. No definite liver surface irregularity. No liver masses, noting decreased sensitivity in the setting of an echogenic liver. Portal vein is patent on color  Doppler imaging with normal direction of blood flow towards the liver. Other: None. IMPRESSION: 1. Normal gallbladder.  No cholelithiasis. 2. No biliary ductal dilatation. 3. Diffuse hepatic steatosis. Electronically Signed   By: Delbert Phenix M.D.   On: 01/23/2020 05:40      Procedures   ____________________________________________   INITIAL IMPRESSION / MDM / ASSESSMENT AND PLAN / ED COURSE  As part of my medical decision making, I reviewed the following data within the electronic MEDICAL RECORD NUMBER  49 year old male presented with above-stated history and physical exam a differential diagnosis including but not limited to gastritis, peptic ulcer disease, cholelithiasis versus other potential gallbladder pathology, pancreatitis.  Laboratory data notable for a glucose of 239 lipase of 89 and a WBC count of 13.6.  Right upper quadrant ultrasound was performed which revealed "normal gallbladder".  Patient was given a GI cocktail with lidocaine with complete resolution of discomfort.  Given my mildly elevated lipase suspect possible pancreatitis which may be secondary to patient's diabetic medication (Janumet).  However I suspect the etiology for the patient's pain to be gastric in  nature given burning sensation as well as complete resolution following GI cocktail.  Patient will be prescribed Protonix for home with recommendation to follow-up with Dr. Servando Snare gastroenterology for further outpatient evaluation.  Patient also advised to follow-up with primary care provider   ____________________________________________  FINAL CLINICAL IMPRESSION(S) / ED DIAGNOSES  Final diagnoses:  RUQ pain  Epigastric pain  Acute pancreatitis, unspecified complication status, unspecified pancreatitis type     MEDICATIONS GIVEN DURING THIS VISIT:  Medications  sodium chloride flush (NS) 0.9 % injection 3 mL (has no administration in time range)  alum & mag hydroxide-simeth (MAALOX/MYLANTA) 200-200-20 MG/5ML  suspension 30 mL (30 mLs Oral Given 01/23/20 0514)    And  lidocaine (XYLOCAINE) 2 % viscous mouth solution 15 mL (15 mLs Oral Given 01/23/20 0514)     ED Discharge Orders    None      *Please note:  REGIS HINTON was evaluated in Emergency Department on 01/23/2020 for the symptoms described in the history of present illness. He was evaluated in the context of the global COVID-19 pandemic, which necessitated consideration that the patient might be at risk for infection with the SARS-CoV-2 virus that causes COVID-19. Institutional protocols and algorithms that pertain to the evaluation of patients at risk for COVID-19 are in a state of rapid change based on information released by regulatory bodies including the CDC and federal and state organizations. These policies and algorithms were followed during the patient's care in the ED.  Some ED evaluations and interventions may be delayed as a result of limited staffing during the pandemic.*  Note:  This document was prepared using Dragon voice recognition software and may include unintentional dictation errors.   Darci Current, MD 01/23/20 [phone]

## 2020-01-23 NOTE — ED Notes (Signed)
US at bedside

## 2020-01-23 NOTE — ED Triage Notes (Signed)
Patient ambulatory to triage via self POV with complaints of epigastric pain for 6 days.  Intermittent irritating to burning pain 6/10 worse after eating or deep breathing and sitting.     Pt reports taking ASA before coming to ED.  Speaking in complete coherent sentences. No acute breathing distress noted.  Hx of DM

## 2020-05-15 ENCOUNTER — Encounter: Payer: Self-pay | Admitting: Podiatry

## 2020-05-15 ENCOUNTER — Other Ambulatory Visit: Payer: Self-pay

## 2020-05-15 ENCOUNTER — Ambulatory Visit (INDEPENDENT_AMBULATORY_CARE_PROVIDER_SITE_OTHER): Payer: No Typology Code available for payment source | Admitting: Podiatry

## 2020-05-15 DIAGNOSIS — E119 Type 2 diabetes mellitus without complications: Secondary | ICD-10-CM

## 2020-05-15 NOTE — Progress Notes (Signed)
This patient returns to the office for his yearly diabetic foot exam.  Patient was previously diagnosed with plantar fasciitis which has resolved.  He also says that he was experiencing peripheral neuropathy pain but that resolved once his hemoglobin A1c leveled off at 6.  He says he and his wife have started using for fruit juices and his hemoglobin A1c has then risen to 9.   His medical doctor told him to stop using the juice since it was pure sugar.  He says that his feet are doing well and not having any pain or discomfort today.  Vascular  Dorsalis pedis  are palpable  B/L.  Posterior tibial pulses are weakly palpable  B/L.  Capillary return  WNL.  Temperature gradient is  WNL.  Skin turgor  WNL  Sensorium  Senn Weinstein monofilament wire  WNL. Normal tactile sensation.  Nail Exam  Patient has normal nails with no evidence of bacterial or fungal infection.  Orthopedic  Exam  Muscle tone and muscle strength  WNL.  No limitations of motion feet  B/L.  No crepitus or joint effusion noted.  Foot type is unremarkable and digits show no abnormalities.  Bony prominences are unremarkable.  No sesamoid pain  B/L.  Pes planus  B/L.  Skin  No open lesions.  Normal skin texture and turgor.  Diabetes with neuropathy.    ROV.  No evidence of vascular or neurological pathology.  Patient has still been using insoles and buying new insoles for his forefeet.  Prescribe 3/4 spenco insoles.  RTC 1 year.   Helane Gunther DPM

## 2021-01-19 ENCOUNTER — Other Ambulatory Visit: Payer: Self-pay

## 2021-01-19 ENCOUNTER — Emergency Department: Payer: No Typology Code available for payment source

## 2021-01-19 ENCOUNTER — Encounter: Payer: Self-pay | Admitting: Emergency Medicine

## 2021-01-19 ENCOUNTER — Emergency Department
Admission: EM | Admit: 2021-01-19 | Discharge: 2021-01-19 | Disposition: A | Discharge status: Home / Self Care | Payer: No Typology Code available for payment source | Attending: Emergency Medicine | Admitting: Emergency Medicine

## 2021-01-19 DIAGNOSIS — E119 Type 2 diabetes mellitus without complications: Secondary | ICD-10-CM | POA: Insufficient documentation

## 2021-01-19 DIAGNOSIS — Z7982 Long term (current) use of aspirin: Secondary | ICD-10-CM | POA: Diagnosis not present

## 2021-01-19 DIAGNOSIS — Z7984 Long term (current) use of oral hypoglycemic drugs: Secondary | ICD-10-CM | POA: Insufficient documentation

## 2021-01-19 DIAGNOSIS — Z8616 Personal history of COVID-19: Secondary | ICD-10-CM | POA: Diagnosis not present

## 2021-01-19 DIAGNOSIS — J40 Bronchitis, not specified as acute or chronic: Secondary | ICD-10-CM | POA: Insufficient documentation

## 2021-01-19 DIAGNOSIS — R059 Cough, unspecified: Secondary | ICD-10-CM | POA: Diagnosis present

## 2021-01-19 MED ORDER — ALBUTEROL SULFATE HFA 108 (90 BASE) MCG/ACT IN AERS
2.0000 | INHALATION_SPRAY | RESPIRATORY_TRACT | Status: DC | PRN
  Filled 2021-01-19: qty 6.7

## 2021-01-19 MED ORDER — BENZONATATE 100 MG PO CAPS: ORAL_CAPSULE | ORAL | 0 refills | Status: DC

## 2021-01-19 MED ORDER — ALBUTEROL SULFATE HFA 108 (90 BASE) MCG/ACT IN AERS: 2.0000 | INHALATION_SPRAY | Freq: Four times a day (QID) | RESPIRATORY_TRACT | 0 refills | Status: DC | PRN

## 2021-01-19 NOTE — ED Notes (Signed)
See triage note. Pt ambulatory to room. Resp reg/unlabored. Pt c/o productive cough. Had covid in Jan. Denies any other complaints.

## 2021-01-19 NOTE — Discharge Instructions (Addendum)
Your exam and chest x-ray are normal and reassuring at this time.  No signs of any acute infection in the lungs.  Take the prescription cough medicine as needed.  Use inhaler as necessary.  You may take over-the-counter dextromethorphan for additional cough relief if necessary.  Follow-up with a local primary provider for ongoing symptoms.  Return to the ED if needed.

## 2021-01-19 NOTE — ED Provider Notes (Signed)
[facility] Emergency Department Provider Note ____________________________________________  Time seen: 58  I have reviewed the triage vital signs and the nursing notes.  HISTORY  Chief Complaint  Cough  HPI Jerome Conner is a 50 y.o. male with history of diabetes, presents himself to the ED for evaluation of persistent cough.  Patient was diagnosed with Covid last month, and since that time he has continued to have intermittent cough.  He describes a productive cough at times but denies any frank chest pain or shortness of breath.   Patient not been taken any medications since his diagnosis, and no medications in the interim for symptom relief.  He attempted to see his primary provider today, but was unable to get an appointment due to his symptoms.  He is simply concerned, because of his previous Covid diagnosis, that he could have underlying infection.  Past Medical History:  Diagnosis Date  . Diabetes mellitus without complication [facility])     Patient Active Problem List   Diagnosis Date Noted  . Type 2 diabetes mellitus (HCC) 10/03/2016    History reviewed. No pertinent surgical history.  Prior to Admission medications   Medication Sig Start Date End Date Taking? Authorizing Provider  albuterol (VENTOLIN HFA) 108 (90 Base) MCG/ACT inhaler Inhale 2 puffs into the lungs every 6 (six) hours as needed for shortness of breath. 01/19/21  Yes Masaye Gatchalian, Charlesetta Ivory, PA-C  benzonatate (TESSALON PERLES) 100 MG capsule Take 1-2 tabs TID prn cough 01/19/21  Yes Bosco Paparella, Charlesetta Ivory, PA-C  aspirin EC 81 MG tablet Take 81 mg by mouth daily.    [provider]  ergocalciferol (VITAMIN D2) 1.25 MG (50000 UT) capsule Take 50,000 Units by mouth once a week.    [provider]  losartan (COZAAR) 25 MG tablet Take 25 mg by mouth daily. Take 1/2 tab daily    [provider]  pantoprazole (PROTONIX) 40 MG tablet Take 1 tablet (40 mg total) by  mouth daily. 01/23/20 03/23/20  Darci Current, MD  sildenafil (REVATIO) 20 MG tablet sildenafil (pulmonary hypertension) 20 mg tablet  TAKE 1 TABLET BY MOUTH EVERY DAY    [provider]  sitaGLIPtin-metformin (JANUMET) 50-1000 MG tablet Take 1 tablet by mouth 2 (two) times daily with a meal.    [provider]    Allergies Penicillin g and Penicillins  History reviewed. No pertinent family history.  Social History Social History   Tobacco Use  . Smoking status: Never Smoker  . Smokeless tobacco: Never Used  Substance Use Topics  . Alcohol use: Yes    Alcohol/week: 1.0 standard drink    Types: 1 Glasses of wine per week  . Drug use: No    Review of Systems  Constitutional: Negative for fever. Eyes: Negative for visual changes. ENT: Negative for sore throat. Cardiovascular: Negative for chest pain. Respiratory: Negative for shortness of breath. Reports productive cough.  Gastrointestinal: Negative for abdominal pain, vomiting and diarrhea. Genitourinary: Negative for dysuria. Musculoskeletal: Negative for back pain. Skin: Negative for rash. Neurological: Negative for headaches, focal weakness or numbness. ____________________________________________  PHYSICAL EXAM:  VITAL SIGNS: ED Triage Vitals  Enc Vitals Group     BP 01/19/21 1834 (!) 138/94     Pulse Rate 01/19/21 1834 89     Resp 01/19/21 1834 18     Temp 01/19/21 1836 97.9 F (36.6 C)     Temp Source 01/19/21 1836 Oral     SpO2 01/19/21 1834 98 %  Weight 01/19/21 1831 250 lb (113.4 kg)     Height 01/19/21 1831 5\' 9"  (1.753 m)     Head Circumference --      Peak Flow --      Pain Score 01/19/21 1831 0     Pain Loc --      Pain Edu? --      Excl. in GC? --     Constitutional: Alert and oriented. Well appearing and in no distress. Head: Normocephalic and atraumatic. Eyes: Conjunctivae are normal. Normal extraocular movements Cardiovascular: Normal rate, regular rhythm. Normal  distal pulses. Respiratory: Normal respiratory effort. No wheezes/rales/rhonchi. Gastrointestinal: Soft and nontender. No distention. Musculoskeletal: Nontender with normal range of motion in all extremities.  Neurologic:  Normal gait without ataxia. Normal speech and language. No gross focal neurologic deficits are appreciated. Skin:  Skin is warm, dry and intact. No rash noted. Psychiatric: Mood and affect are normal. Patient exhibits appropriate insight and judgment. ____________________________________________   RADIOLOGY  CXR  IMPRESSION: No active cardiopulmonary disease. ____________________________________________  PROCEDURES  Procedures ____________________________________________  INITIAL IMPRESSION / ASSESSMENT AND PLAN / ED COURSE  DDX: bronchitis, CAP, PE, viral URI  Patient with ED evaluation of ongoing intermittent cough in the 4 weeks since his Covid diagnosis.  Patient presents without signs of acute respiratory distress, dehydration, or chest pain.  Vital signs are stable and patient is without signs of pleuritic chest pain.  Doubt PE based on his presentation normal blood pressure and heart rate.  Chest x-ray does not reveal any acute intrathoracic process or infection.  Patient is overall reassured by his work-up.  Symptoms likely represent ongoing viral bronchitis related to his recent diagnosis of COVID.  He will be discharged with a prescription for albuterol inhaler and Tessalon Perles.  He is encouraged to follow-up with primary provider for ongoing symptoms.  Return precautions have been discussed.   Jerome Conner was evaluated in Emergency Department on 01/19/2021 for the symptoms described in the history of present illness. He was evaluated in the context of the global COVID-19 pandemic, which necessitated consideration that the patient might be at risk for infection with the SARS-CoV-2 virus that causes COVID-19. Institutional protocols and algorithms that  pertain to the evaluation of patients at risk for COVID-19 are in a state of rapid change based on information released by regulatory bodies including the CDC and federal and state organizations. These policies and algorithms were followed during the patient's care in the ED. ____________________________________________  FINAL CLINICAL IMPRESSION(S) / ED DIAGNOSES  Final diagnoses:  Bronchitis      Jerome Conner, 01/21/2021, PA-C 01/19/21 2036    2037, MD 01/19/21 2118

## 2021-01-19 NOTE — ED Triage Notes (Signed)
Pt comes into the ED via POV c/o cough x 4 weeks.  Pt states it is productive.  Denies any CP or SHOB.  Pt ambulatory to triage at this time and in NAD.  Pt diagnosed with COVID on 12/20/20.

## 2021-01-31 IMAGING — CT CT RENAL STONE PROTOCOL
2 of 4 series · 17 of 46 positions shown, 19 images · non-contrast
Comparison: None.

CLINICAL DATA: Hematuria

EXAM:
CT ABDOMEN AND PELVIS WITHOUT CONTRAST
TECHNIQUE: Multidetector CT imaging of the abdomen and pelvis was performed
following the standard protocol without IV contrast.

[Series 2: stone full standard · axial · 0.86mm/px · z∈[-1169,-704]mm · 14 of 103 slices shown, 16 images]
[im 5/103  soft-tissue]
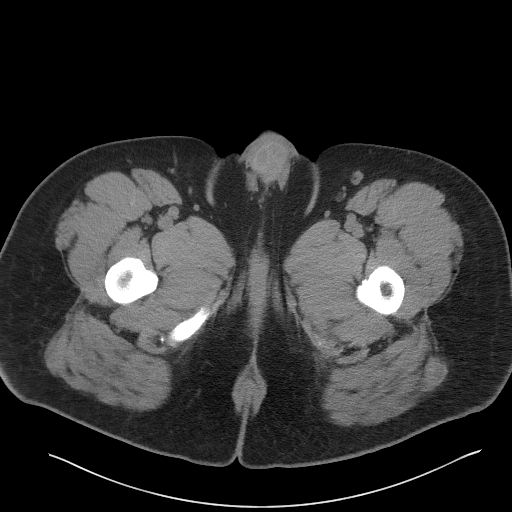
[im 5/103  bone]
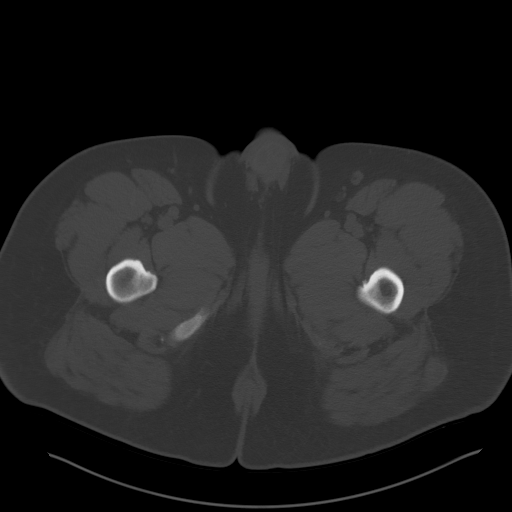
[im 14/103  soft-tissue]
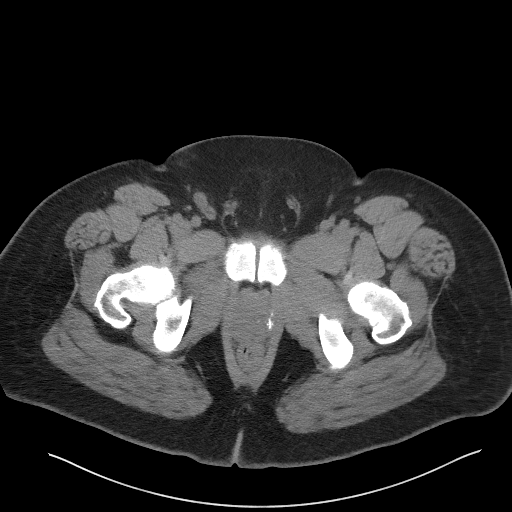
[im 18/103  soft-tissue]
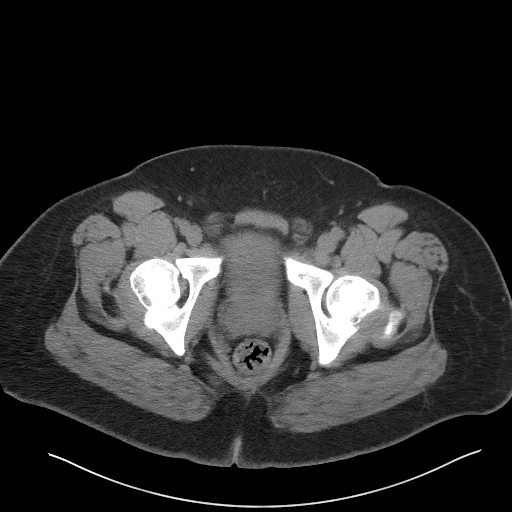
[im 27/103  soft-tissue]
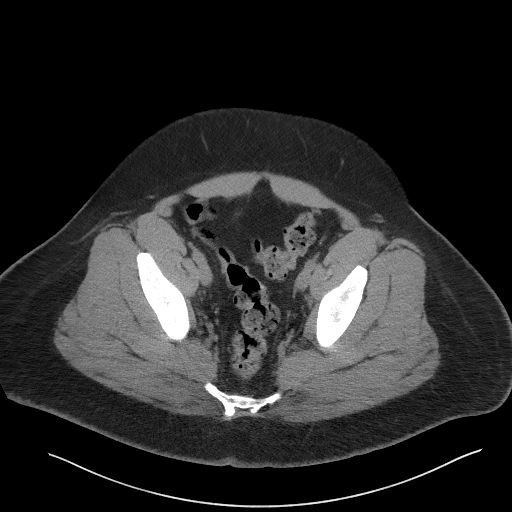
[im 36/103  soft-tissue]
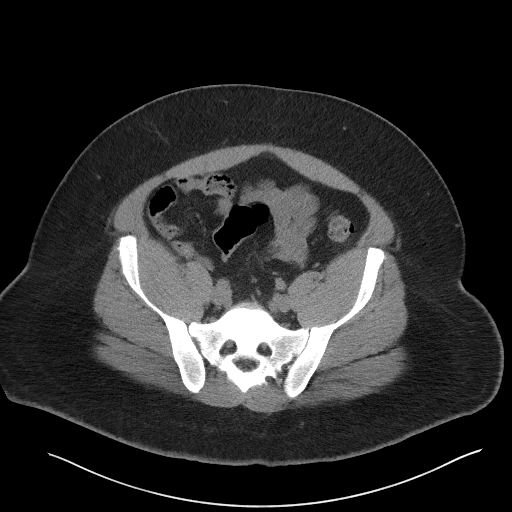
[im 40/103  soft-tissue]
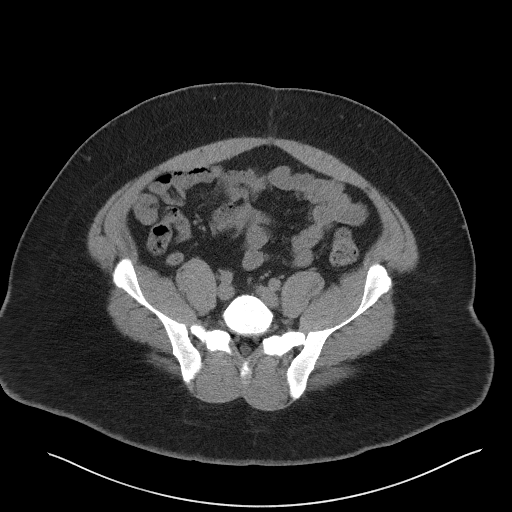
[im 49/103  soft-tissue]
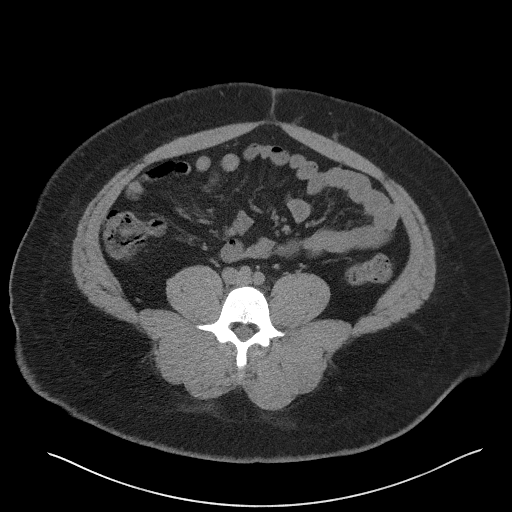
[im 54/103  soft-tissue]
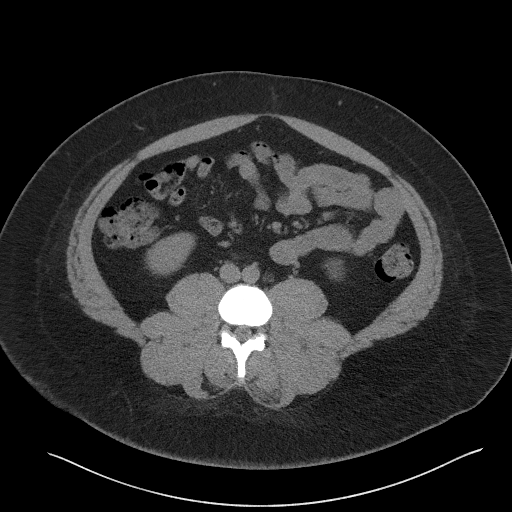
[im 63/103  soft-tissue]
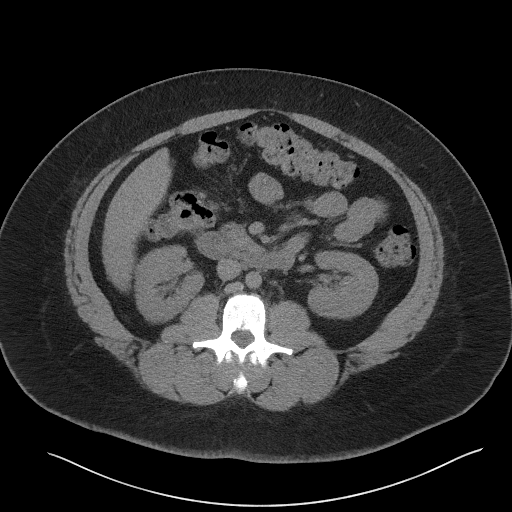
[im 63/103  bone]
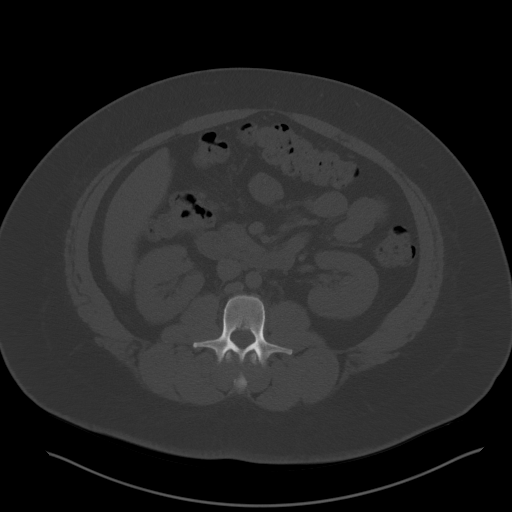
[im 67/103  soft-tissue]
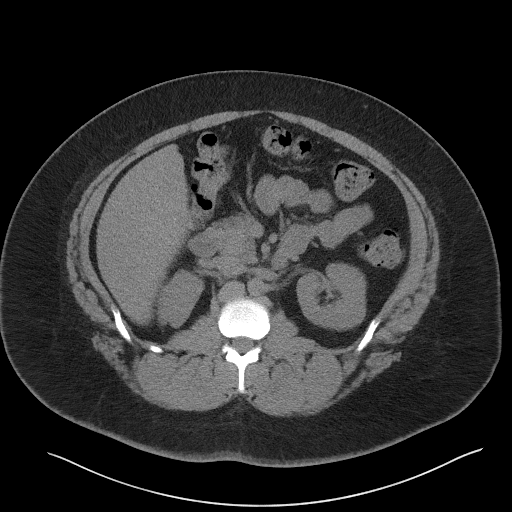
[im 76/103  soft-tissue]
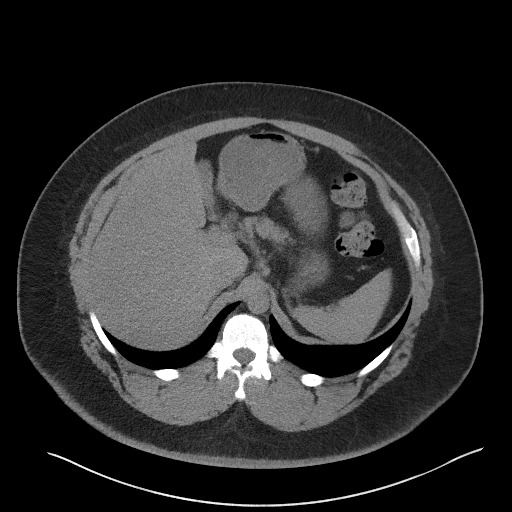
[im 85/103  soft-tissue]
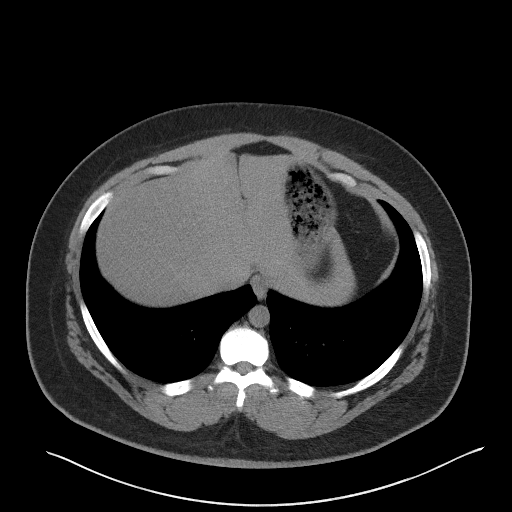
[im 89/103  soft-tissue]
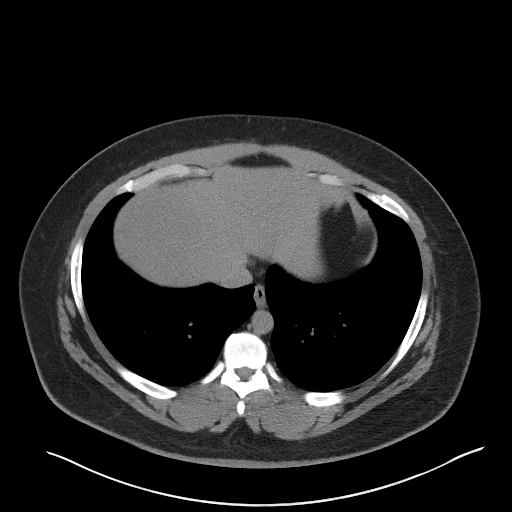
[im 98/103  soft-tissue]
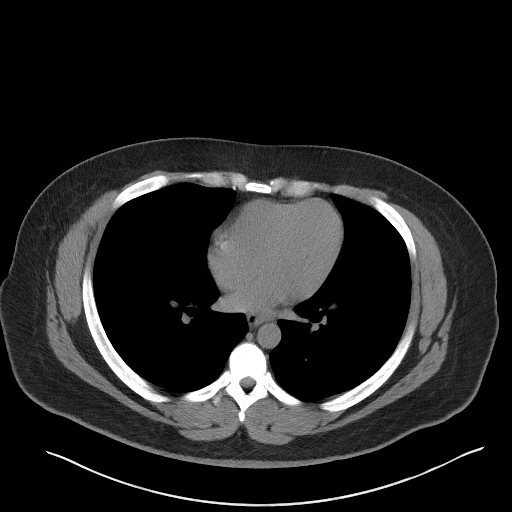

[Series 5: coronal · coronal · 0.92mm/px · 3 of 159 slices shown]
[im 53/159  soft-tissue]
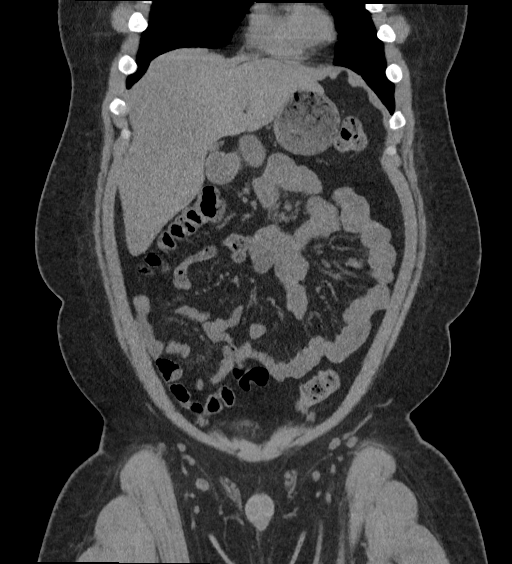
[im 71/159  soft-tissue]
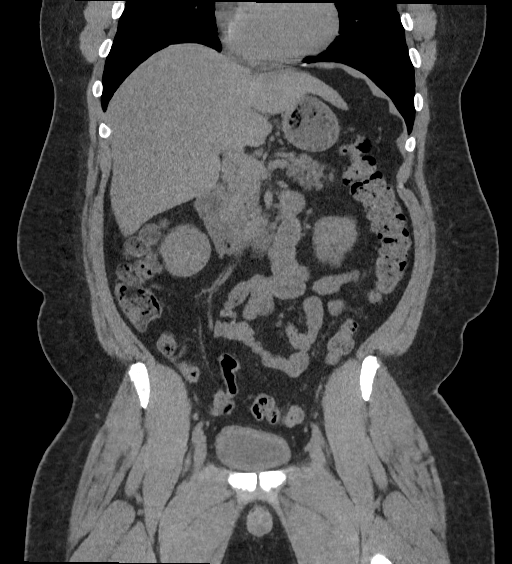
[im 88/159  soft-tissue]
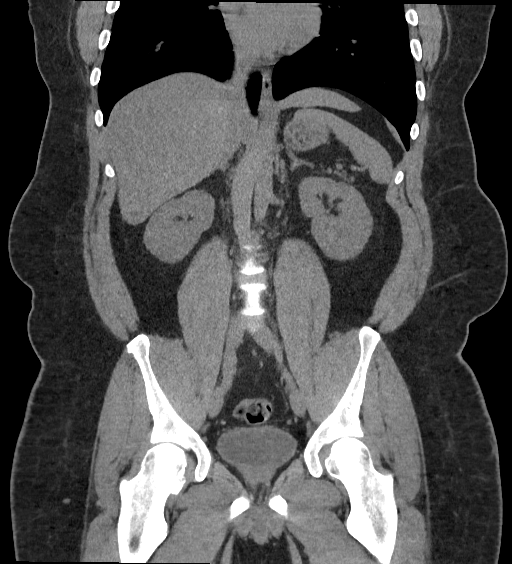

[17 of 46 positions shown; findings below may reference images not displayed]

FINDINGS: Lower chest: Lung bases are clear. No effusions. Heart is normal
size.

Hepatobiliary: Diffuse low-density throughout the liver compatible
with fatty infiltration. No focal abnormality. Gallbladder
unremarkable.

Pancreas: No focal abnormality or ductal dilatation.

Spleen: No focal abnormality.  Normal size.

Adrenals/Urinary Tract: No adrenal abnormality. No focal renal
abnormality. No stones or hydronephrosis. Urinary bladder is
unremarkable.

Stomach/Bowel: Stomach, large and small bowel grossly unremarkable.

Vascular/Lymphatic: No evidence of aneurysm or adenopathy.

Reproductive: Prominent prostate with calcifications.

Other: No free fluid or free air.

Musculoskeletal: No acute bony abnormality.
IMPRESSION: Fatty infiltration of the liver.

No renal or ureteral stones.  No hydronephrosis.

Prostate enlargement.

No acute findings.

## 2021-02-21 ENCOUNTER — Emergency Department: Payer: No Typology Code available for payment source

## 2021-02-21 ENCOUNTER — Emergency Department
Admission: EM | Admit: 2021-02-21 | Discharge: 2021-02-21 | Disposition: A | Discharge status: Home / Self Care | Payer: No Typology Code available for payment source | Attending: Emergency Medicine | Admitting: Emergency Medicine

## 2021-02-21 ENCOUNTER — Other Ambulatory Visit: Payer: Self-pay

## 2021-02-21 DIAGNOSIS — Z7982 Long term (current) use of aspirin: Secondary | ICD-10-CM | POA: Insufficient documentation

## 2021-02-21 DIAGNOSIS — E119 Type 2 diabetes mellitus without complications: Secondary | ICD-10-CM | POA: Diagnosis not present

## 2021-02-21 DIAGNOSIS — Z7984 Long term (current) use of oral hypoglycemic drugs: Secondary | ICD-10-CM | POA: Insufficient documentation

## 2021-02-21 DIAGNOSIS — R002 Palpitations: Secondary | ICD-10-CM | POA: Insufficient documentation

## 2021-02-21 LAB — COMPREHENSIVE METABOLIC PANEL
ALT: 19 U/L (ref 0–44)
AST: 17 U/L (ref 15–41)
Albumin: 3.6 g/dL (ref 3.5–5.0)
Alkaline Phosphatase: 45 U/L (ref 38–126)
Anion gap: 6 (ref 5–15)
BUN: 16 mg/dL (ref 6–20)
CO2: 27 mmol/L (ref 22–32)
Calcium: 9 mg/dL (ref 8.9–10.3)
Chloride: 103 mmol/L (ref 98–111)
Creatinine, Ser: 1.47 mg/dL — ABNORMAL HIGH (ref 0.61–1.24)
GFR, Estimated: 58 mL/min — ABNORMAL LOW (ref >60)
Glucose, Bld: 238 mg/dL — ABNORMAL HIGH (ref 70–99)
Potassium: 3.9 mmol/L (ref 3.5–5.1)
Sodium: 136 mmol/L (ref 135–145)
Total Bilirubin: 0.4 mg/dL (ref 0.3–1.2)
Total Protein: 7.2 g/dL (ref 6.5–8.1)

## 2021-02-21 LAB — CBC WITH DIFFERENTIAL/PLATELET
Abs Immature Granulocytes: 0.05 10*3/uL (ref 0.00–0.07)
Basophils Absolute: 0 10*3/uL (ref 0.0–0.1)
Basophils Relative: 0 %
Eosinophils Absolute: 0.1 10*3/uL (ref 0.0–0.5)
Eosinophils Relative: 1 %
HCT: 39.2 % (ref 39.0–52.0)
Hemoglobin: 12.7 g/dL — ABNORMAL LOW (ref 13.0–17.0)
Immature Granulocytes: 1 %
Lymphocytes Relative: 36 %
Lymphs Abs: 3.8 10*3/uL (ref 0.7–4.0)
MCH: 26.3 pg (ref 26.0–34.0)
MCHC: 32.4 g/dL (ref 30.0–36.0)
MCV: 81.3 fL (ref 80.0–100.0)
Monocytes Absolute: 0.6 10*3/uL (ref 0.1–1.0)
Monocytes Relative: 6 %
Neutro Abs: 5.8 10*3/uL (ref 1.7–7.7)
Neutrophils Relative %: 56 %
Platelets: 229 10*3/uL (ref 150–400)
RBC: 4.82 MIL/uL (ref 4.22–5.81)
RDW: 14.6 % (ref 11.5–15.5)
WBC: 10.5 10*3/uL (ref 4.0–10.5)
nRBC: 0 % (ref 0.0–0.2)

## 2021-02-21 LAB — T4, FREE: Free T4: 0.89 ng/dL (ref 0.61–1.12)

## 2021-02-21 LAB — TSH: TSH: 1.704 u[IU]/mL (ref 0.350–4.500)

## 2021-02-21 LAB — TROPONIN I (HIGH SENSITIVITY): Troponin I (High Sensitivity): 3 ng/L (ref <18)

## 2021-02-21 NOTE — ED Notes (Signed)
Pt states coming in with chest palpitations for a week. Pt denies pain or shortness of breath. Pt states a history of anxiety with palpitations, pt states he has been under stress. Pt on cardiac monitor.

## 2021-02-21 NOTE — ED Provider Notes (Signed)
[facility]-EMERGENCY DEPARTMENT  ____________________________________________  Time seen: Approximately 5:11 PM  I have reviewed the triage vital signs and the nursing notes.   HISTORY  Chief Complaint Palpitations   Historian Patient     HPI Jerome Conner is a 50 y.o. male presents to the emergency department with palpitations at rest for the past week.  Patient states that he has experienced the sensation of palpitations in the past.  He states that in the past, palpitations are sometimes associated with anxiety.  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  He states that he normally feels anxious from day-to-day and does not have a stress management plan in place on a day-to-day basis.  He is not a daily smoker.  He states that he has a history of hypertension and takes daily losartan.  Other than hypertension, he has no other cardiac issues to his knowledge.  He denies current chest pain, chest tightness or shortness of breath.  He has not been tested for thyroid abnormalities recently by his primary care provider.  Patient states that he has cut out all caffeine.  He reports that he has not been sleeping well but he typically does not sleep well.  He denies abdominal discomfort.    Past Medical History:  Diagnosis Date  . Diabetes mellitus without complication (HCC)      Immunizations up to date:  Yes.     Past Medical History:  Diagnosis Date  . Diabetes mellitus without complication [facility])     Patient Active Problem List   Diagnosis Date Noted  . Type 2 diabetes mellitus (HCC) 10/03/2016    Past Surgical History:  Procedure Laterality Date  . FRACTURE SURGERY      Prior to Admission medications   Medication Sig Start Date End Date Taking? Authorizing Provider  albuterol (VENTOLIN HFA) 108 (90 Base) MCG/ACT inhaler Inhale 2 puffs into the lungs every 6 (six) hours  as needed for shortness of breath. 01/19/21   Menshew, Charlesetta Ivory, PA-C  aspirin EC 81 MG tablet Take 81 mg by mouth daily.    [provider]  benzonatate (TESSALON PERLES) 100 MG capsule Take 1-2 tabs TID prn cough 01/19/21   Menshew, Charlesetta Ivory, PA-C  ergocalciferol (VITAMIN D2) 1.25 MG (50000 UT) capsule Take 50,000 Units by mouth once a week.    [provider]  losartan (COZAAR) 25 MG tablet Take 25 mg by mouth daily. Take 1/2 tab daily    [provider]  pantoprazole (PROTONIX) 40 MG tablet Take 1 tablet (40 mg total) by mouth daily. 01/23/20 03/23/20  Darci Current, MD  sildenafil (REVATIO) 20 MG tablet sildenafil (pulmonary hypertension) 20 mg tablet  TAKE 1 TABLET BY MOUTH EVERY DAY    [provider]  sitaGLIPtin-metformin (JANUMET) 50-1000 MG tablet Take 1 tablet by mouth 2 (two) times daily with a meal.    [provider]    Allergies Penicillin g and Penicillins  No family history on file.  Social History Social History   Tobacco Use  . Smoking status: Never Smoker  . Smokeless tobacco: Never Used  Substance Use Topics  . Alcohol use: Yes    Alcohol/week: 1.0 standard drink    Types: 1 Glasses of wine per week  . Drug use: No     Review of Systems  Constitutional: No fever/chills Eyes:  No  discharge ENT: No upper respiratory complaints. Respiratory: no cough. No SOB/ use of accessory muscles to breath Cardiac: Patient has palpitations.  Gastrointestinal:   No nausea, no vomiting.  No diarrhea.  No constipation. Musculoskeletal: Negative for musculoskeletal pain. Skin: Negative for rash, abrasions, lacerations, ecchymosis.    ____________________________________________   PHYSICAL EXAM:  VITAL SIGNS: ED Triage Vitals  Enc Vitals Group     BP 02/21/21 1614 (!) 159/94     Pulse Rate 02/21/21 1614 87     Resp 02/21/21 1614 14     Temp 02/21/21 1614 98.6 F (37 C)     Temp Source 02/21/21 1614 Oral      SpO2 02/21/21 1614 99 %     Weight 02/21/21 1615 255 lb (115.7 kg)     Height 02/21/21 1615 5\' 9"  (1.753 m)     Head Circumference --      Peak Flow --      Pain Score 02/21/21 1614 0     Pain Loc --      Pain Edu? --      Excl. in GC? --      Constitutional: Alert and oriented. Well appearing and in no acute distress. Eyes: Conjunctivae are normal. PERRL. EOMI. Head: Atraumatic. ENT:      Nose: No congestion/rhinnorhea.      Mouth/Throat: Mucous membranes are moist.  Neck: No stridor.  No cervical spine tenderness to palpation. Cardiovascular: Normal rate, regular rhythm. Normal S1 and S2.  Good peripheral circulation. Respiratory: Normal respiratory effort without tachypnea or retractions. Lungs CTAB. Good air entry to the bases with no decreased or absent breath sounds Gastrointestinal: Bowel sounds x 4 quadrants. Soft and nontender to palpation. No guarding or rigidity. No distention. Musculoskeletal: Full range of motion to all extremities. No obvious deformities noted Neurologic:  Normal for age. No gross focal neurologic deficits are appreciated.  Skin:  Skin is warm, dry and intact. No rash noted. Psychiatric: Mood and affect are normal for age. Speech and behavior are normal.   ____________________________________________   LABS (all labs ordered are listed, but only abnormal results are displayed)  Labs Reviewed  CBC WITH DIFFERENTIAL/PLATELET - Abnormal; Notable for the following components:      Result Value   Hemoglobin 12.7 (*)    All other components within normal limits  COMPREHENSIVE METABOLIC PANEL - Abnormal; Notable for the following components:   Glucose, Bld 238 (*)    Creatinine, Ser 1.47 (*)    GFR, Estimated 58 (*)    All other components within normal limits  TSH  T4, FREE  TROPONIN I (HIGH SENSITIVITY)  TROPONIN I (HIGH SENSITIVITY)    ____________________________________________  EKG   ____________________________________________  RADIOLOGY 02/23/21, personally viewed and evaluated these images (plain radiographs) as part of my medical decision making, as well as reviewing the written report by the radiologist.    DG Chest 2 View  Result Date: 02/21/2021 CLINICAL DATA:  Palpitations at rest EXAM: CHEST - 2 VIEW COMPARISON:  01/19/2021 FINDINGS: The heart size and mediastinal contours are within normal limits. Both lungs are clear. The visualized skeletal structures are unremarkable. IMPRESSION: No active cardiopulmonary disease. Electronically Signed   By: 01/21/2021 M.D.   On: 02/21/2021 17:52    ____________________________________________    PROCEDURES  Procedure(s) performed:     Procedures     Medications - No data to display   ____________________________________________   INITIAL IMPRESSION / ASSESSMENT AND PLAN / ED COURSE  Pertinent labs & imaging results that were available during my care of the patient were reviewed by me and considered in my medical decision making (see chart for details).  Clinical Course as of 02/21/21 1914  Wed Feb 21, 2021  1838 TSH: 1.704 [JW]    Clinical Course User Index [JW] Orvil Feil, PA-C     Assessment and plan: Palpitations:  50 year old male presents to the emergency department with a sensation of palpitations that occurs at rest.  Patient was hypertensive at triage but vital signs were otherwise reassuring.  On exam, patient had no increased work of breathing and no adventitious lung sounds were auscultated.  Differential diagnosis includes hyperthyroidism, anxiety, PVCs.  CBC was reassuring.  CMP showed mildly elevated creatinine but was otherwise reassuring.  Troponin was within reference range.  TSH and T4 were within reference range.  Chest x-ray shows no signs of cardiac enlargement, consolidations or  opacities.  Highly suspicious for stress as a source of palpitations.  Recommended stress management and also provided counseling resources in patient's discharge paperwork.  Return precautions were given to return with new or worsening symptoms.  All patient questions were answered.    ____________________________________________  FINAL CLINICAL IMPRESSION(S) / ED DIAGNOSES  Final diagnoses:  Palpitations      NEW MEDICATIONS STARTED DURING THIS VISIT:  ED Discharge Orders    None          This chart was dictated using voice recognition software/Dragon. Despite best efforts to proofread, errors can occur which can change the meaning. Any change was purely unintentional.     Orvil Feil, PA-C 02/21/21 1917    Phineas Semen, MD 02/21/21 2051

## 2021-02-21 NOTE — ED Triage Notes (Addendum)
Pt to ER via POV with complaints of palpitations that are at worst when resting.   Pt denies chest pain, denies shortness of breath. Reports this has been happening for a while but that this week it has gotten worse. States it improves with movement.   Pt discussed with Md Bradler. No labwork ordered at this time.

## 2021-02-25 IMAGING — US US ABDOMEN LIMITED
1 series · 14 of 25 positions shown · non-contrast
Comparison: Unenhanced CT abdomen/pelvis from 12/28/2018

CLINICAL DATA: Right upper quadrant abdominal pain for 6 days

EXAM:
ULTRASOUND ABDOMEN LIMITED RIGHT UPPER QUADRANT

[Series 1: us abdomen limited · 14 of 89 slices shown]
[im 1/89]
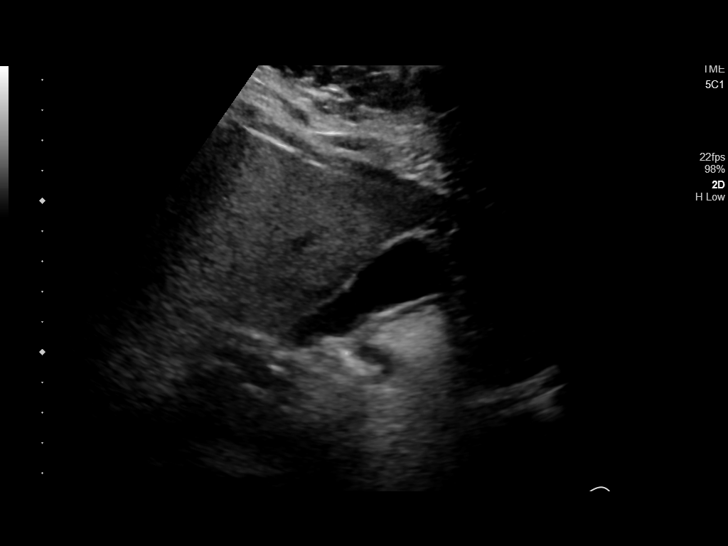
[im 8/89]
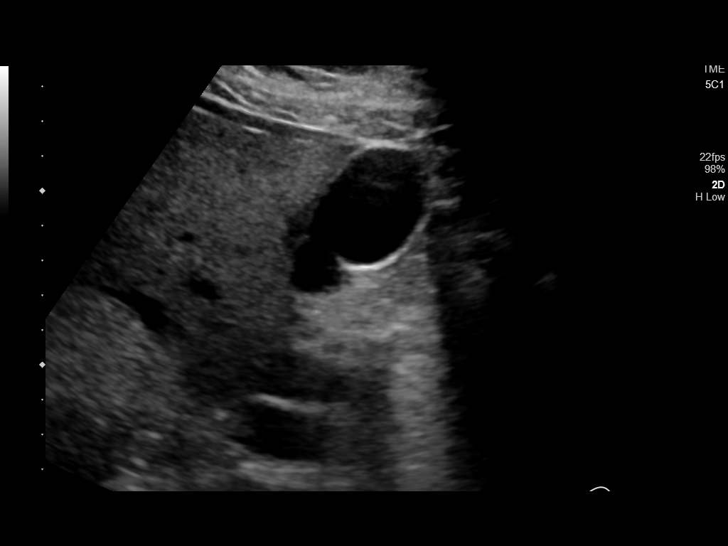
[im 15/89]
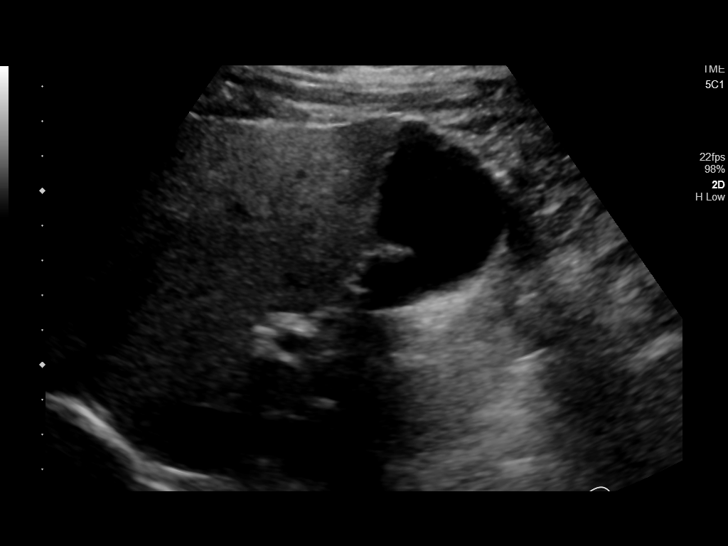
[im 23/89]
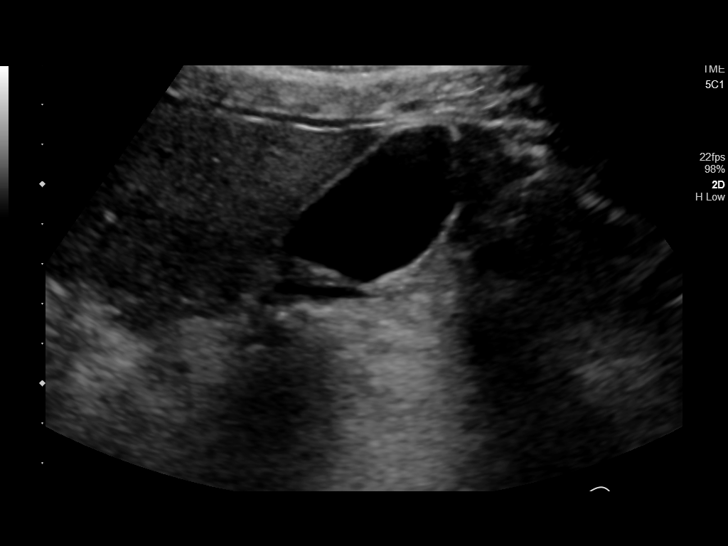
[im 30/89]
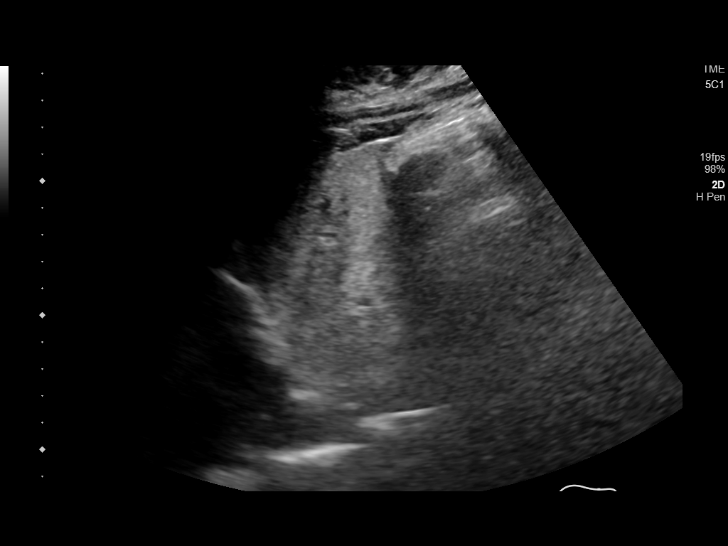
[im 34/89]
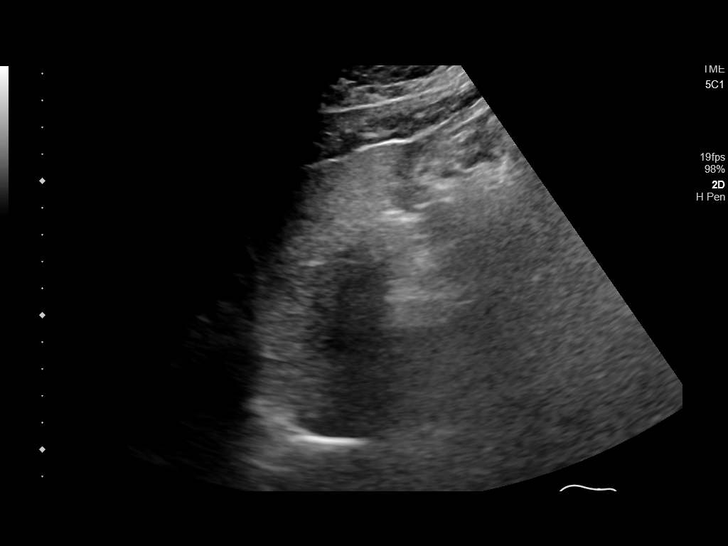
[im 41/89]
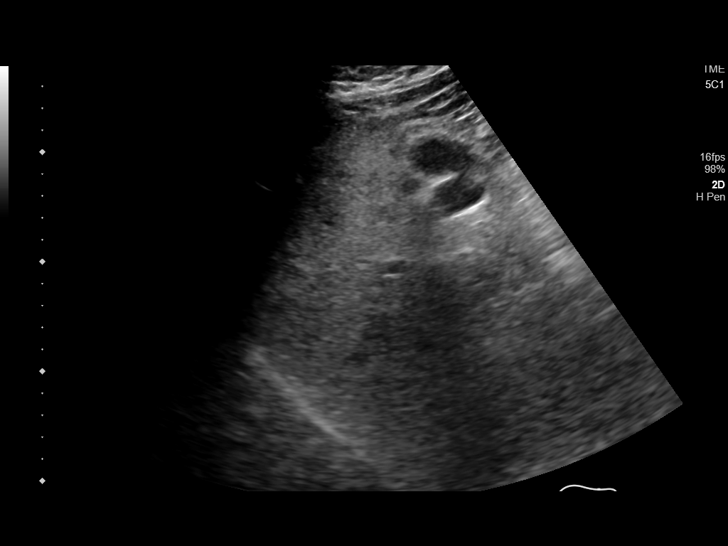
[im 48/89]
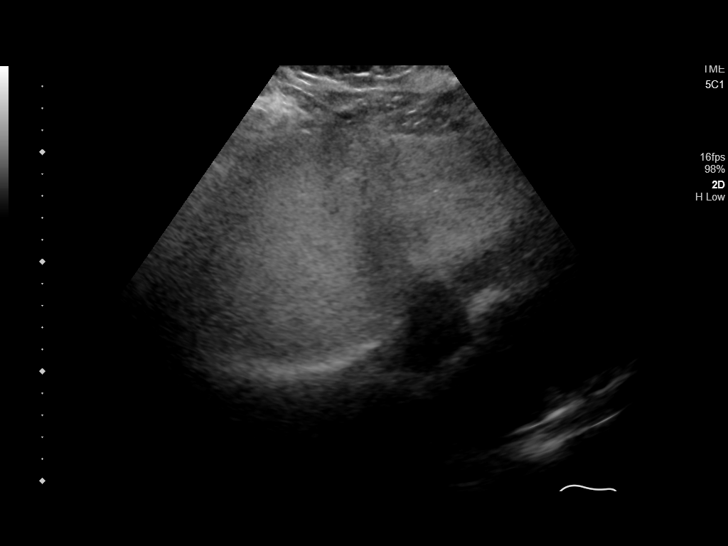
[im 56/89]
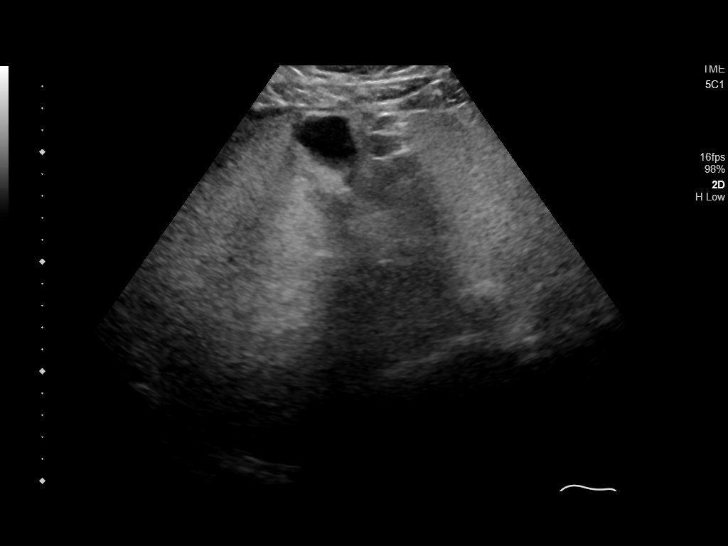
[im 59/89]
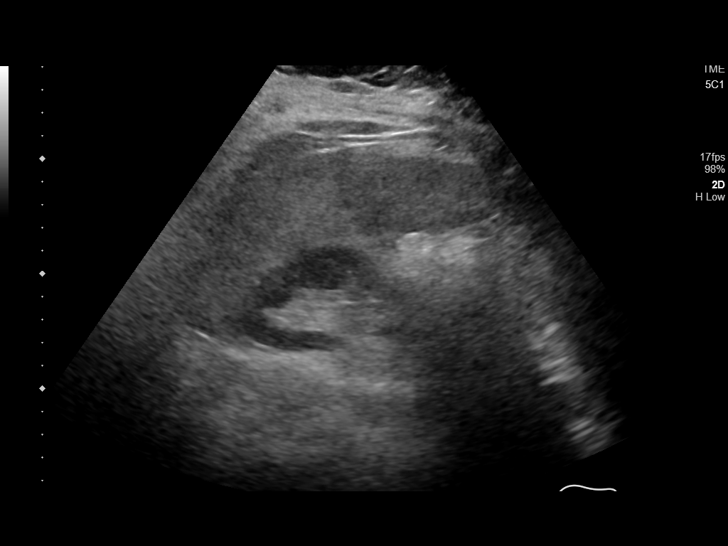
[im 67/89]
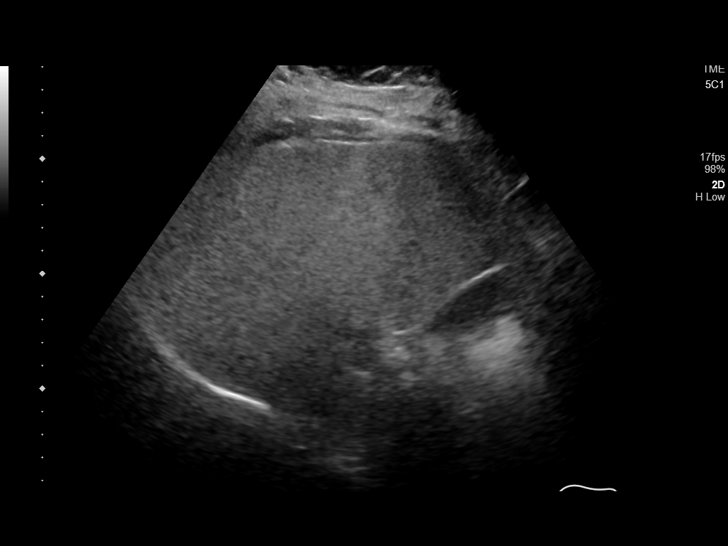
[im 74/89]
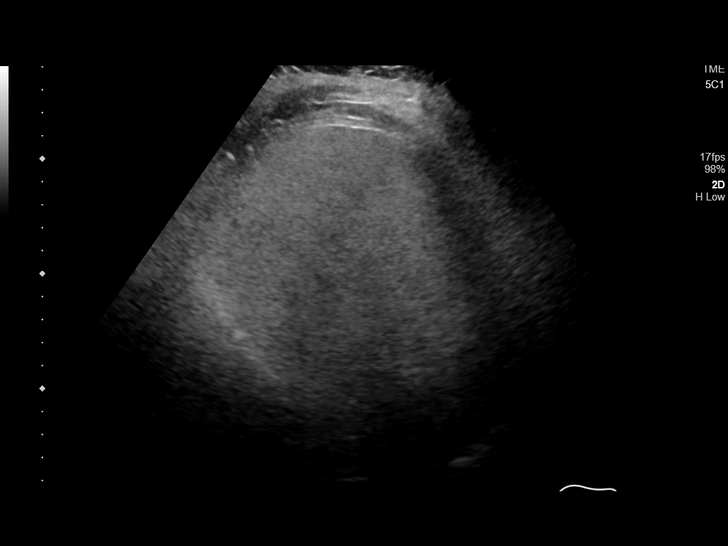
[im 81/89]
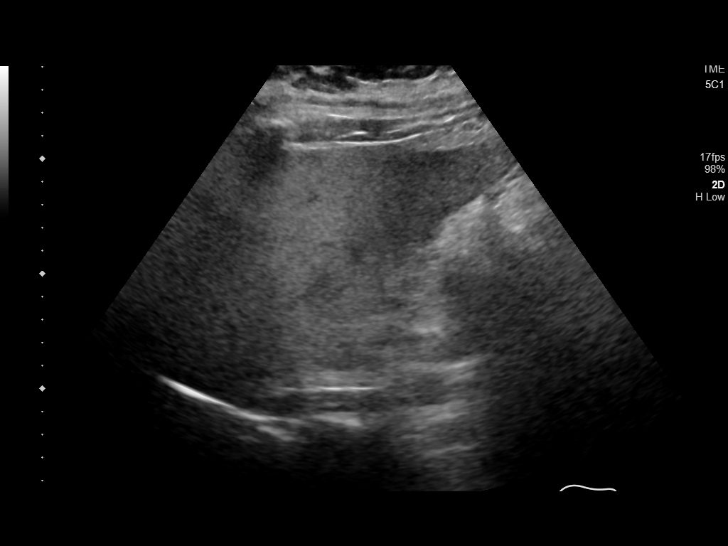
[im 89/89]
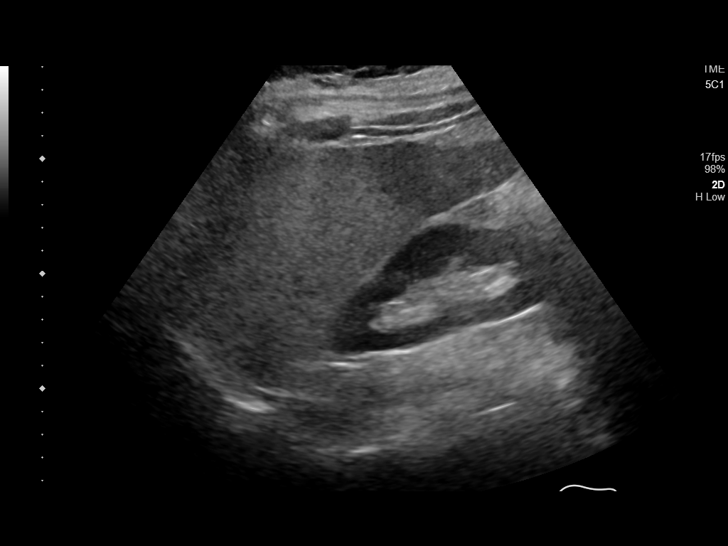

[14 of 25 positions shown; findings below may reference images not displayed]

FINDINGS: Gallbladder:

No gallstones or wall thickening visualized. No sonographic Murphy
sign noted by sonographer.

Common bile duct:

Diameter: 2 mm

Liver:

Markedly echogenic liver parenchyma with posterior acoustic
attenuation, compatible with diffuse hepatic steatosis. No definite
liver surface irregularity. No liver masses, noting decreased
sensitivity in the setting of an echogenic liver. Portal vein is
patent on color Doppler imaging with normal direction of blood flow
towards the liver.

Other: None.
IMPRESSION: 1. Normal gallbladder.  No cholelithiasis.
2. No biliary ductal dilatation.
3. Diffuse hepatic steatosis.

## 2021-05-17 ENCOUNTER — Encounter: Payer: Self-pay | Admitting: Podiatry

## 2021-05-17 ENCOUNTER — Ambulatory Visit (INDEPENDENT_AMBULATORY_CARE_PROVIDER_SITE_OTHER): Payer: No Typology Code available for payment source | Admitting: Podiatry

## 2021-05-17 ENCOUNTER — Other Ambulatory Visit: Payer: Self-pay

## 2021-05-17 DIAGNOSIS — M205X2 Other deformities of toe(s) (acquired), left foot: Secondary | ICD-10-CM | POA: Diagnosis not present

## 2021-05-17 DIAGNOSIS — M205X1 Other deformities of toe(s) (acquired), right foot: Secondary | ICD-10-CM | POA: Diagnosis not present

## 2021-05-17 DIAGNOSIS — E119 Type 2 diabetes mellitus without complications: Secondary | ICD-10-CM | POA: Diagnosis not present

## 2021-05-17 NOTE — Progress Notes (Signed)
This patient returns to the office for his yearly diabetic foot exam.   He also says that he was experiencing peripheral neuropathy pain but that resolved once his hemoglobin A1c leveled off at 6 . He says that his feet are doing well and not having any pain or discomfort today.  Vascular  Dorsalis pedis  are palpable  B/L.  Posterior tibial pulses are weakly palpable  B/L.  Capillary return  WNL.  Temperature gradient is  WNL.  Skin turgor  WNL  Sensorium  Senn Weinstein monofilament wire  WNL. Normal tactile sensation.  Nail Exam  Patient has normal nails with no evidence of bacterial or fungal infection.  Orthopedic  Exam  Muscle tone and muscle strength  WNL.  No limitations of motion feet  B/L.  No crepitus or joint effusion noted.  Foot type is unremarkable and digits show no abnormalities.  Bony prominences are unremarkable.   Pes planus  B/L. Functional hallux limitus 1st MPJ  B/L.  Skin  No open lesions.  Normal skin texture and turgor.  Diabetes with neuropathy.  Functional hallux limitus  B/L.  ROV.  No evidence of vascular or neurological pathology.  Patient has still been using insoles and buying new insoles for his forefeet.  Prescribe 3/4 spenco insoles.  RTC 1 year.   Helane Gunther DPM

## 2021-05-21 ENCOUNTER — Other Ambulatory Visit: Payer: No Typology Code available for payment source

## 2021-07-18 ENCOUNTER — Other Ambulatory Visit: Payer: Self-pay

## 2021-07-18 ENCOUNTER — Encounter: Payer: Self-pay | Admitting: Podiatry

## 2021-07-18 ENCOUNTER — Ambulatory Visit (INDEPENDENT_AMBULATORY_CARE_PROVIDER_SITE_OTHER): Payer: No Typology Code available for payment source | Admitting: Podiatry

## 2021-07-18 DIAGNOSIS — E119 Type 2 diabetes mellitus without complications: Secondary | ICD-10-CM | POA: Diagnosis not present

## 2021-07-18 DIAGNOSIS — M205X1 Other deformities of toe(s) (acquired), right foot: Secondary | ICD-10-CM

## 2021-07-18 DIAGNOSIS — M205X2 Other deformities of toe(s) (acquired), left foot: Secondary | ICD-10-CM

## 2021-07-18 NOTE — Progress Notes (Addendum)
Patient presents to be casted for orthotics  A foam impression was casted for his right and left foot  Patient is a size 10 1/2  Patient will be contacted when the orthotics are ready for pick up  Helane Gunther DPM

## 2021-07-30 ENCOUNTER — Telehealth: Payer: Self-pay | Admitting: Podiatry

## 2021-07-30 NOTE — Telephone Encounter (Signed)
Pt returned call and is interested in orthotics and was aware of the cost and of the payment plan but is going to call the insurance and verify coverage and let me know if he wants to proceed. I also gave him the information to go to [employer] for the spenco inserts that Dr Stacie Acres Recommended. He stated he just wants his feet to stop hurting.

## 2021-07-30 NOTE — Telephone Encounter (Signed)
Left message for pt to call to discuss the orthotics that he was measured for.

## 2021-07-30 NOTE — Telephone Encounter (Deleted)
Error

## 2021-07-30 NOTE — Telephone Encounter (Addendum)
error 

## 2021-07-31 ENCOUNTER — Telehealth: Payer: Self-pay | Admitting: Podiatry

## 2021-07-31 NOTE — Telephone Encounter (Signed)
Pt called his insurance and as long as the orthotics are medically neccessary they will cover them and pt wants to proceed with the orthotics. If we could attach a letter of medical necessity to the claim please.

## 2021-08-15 ENCOUNTER — Other Ambulatory Visit: Payer: No Typology Code available for payment source

## 2021-08-27 ENCOUNTER — Other Ambulatory Visit: Payer: Self-pay

## 2021-08-27 ENCOUNTER — Ambulatory Visit: Payer: No Typology Code available for payment source

## 2021-08-27 DIAGNOSIS — M205X2 Other deformities of toe(s) (acquired), left foot: Secondary | ICD-10-CM

## 2021-08-27 DIAGNOSIS — M205X1 Other deformities of toe(s) (acquired), right foot: Secondary | ICD-10-CM

## 2021-08-27 NOTE — Progress Notes (Signed)
Patient presents for orthotic pick up.  Verbal and written break in and wear instructions given.  Patient will follow up in 4 weeks with Dr if symptoms worsen or fail to improve. 

## 2021-08-27 NOTE — Patient Instructions (Signed)

## 2021-09-07 ENCOUNTER — Emergency Department
Admission: EM | Admit: 2021-09-07 | Discharge: 2021-09-07 | Disposition: A | Discharge status: Home / Self Care | Payer: No Typology Code available for payment source | Attending: Emergency Medicine | Admitting: Emergency Medicine

## 2021-09-07 ENCOUNTER — Other Ambulatory Visit: Payer: Self-pay

## 2021-09-07 DIAGNOSIS — Z5321 Procedure and treatment not carried out due to patient leaving prior to being seen by health care provider: Secondary | ICD-10-CM | POA: Diagnosis not present

## 2021-09-07 DIAGNOSIS — R110 Nausea: Secondary | ICD-10-CM | POA: Diagnosis not present

## 2021-09-07 DIAGNOSIS — R42 Dizziness and giddiness: Secondary | ICD-10-CM | POA: Insufficient documentation

## 2021-09-07 LAB — URINALYSIS, COMPLETE (UACMP) WITH MICROSCOPIC
Bacteria, UA: NONE SEEN
Bilirubin Urine: NEGATIVE
Glucose, UA: NEGATIVE mg/dL
Hgb urine dipstick: NEGATIVE
Ketones, ur: NEGATIVE mg/dL
Leukocytes,Ua: NEGATIVE
Nitrite: NEGATIVE
Protein, ur: NEGATIVE mg/dL
Specific Gravity, Urine: 1.009 (ref 1.005–1.030)
WBC, UA: NONE SEEN WBC/hpf (ref 0–5)
pH: 5 (ref 5.0–8.0)

## 2021-09-07 LAB — BASIC METABOLIC PANEL
Anion gap: 11 (ref 5–15)
BUN: 15 mg/dL (ref 6–20)
CO2: 27 mmol/L (ref 22–32)
Calcium: 9.3 mg/dL (ref 8.9–10.3)
Chloride: 97 mmol/L — ABNORMAL LOW (ref 98–111)
Creatinine, Ser: 1.22 mg/dL (ref 0.61–1.24)
GFR, Estimated: >60 mL/min (ref >60)
Glucose, Bld: 195 mg/dL — ABNORMAL HIGH (ref 70–99)
Potassium: 4.6 mmol/L (ref 3.5–5.1)
Sodium: 135 mmol/L (ref 135–145)

## 2021-09-07 LAB — CBC
HCT: 43.7 % (ref 39.0–52.0)
Hemoglobin: 14.3 g/dL (ref 13.0–17.0)
MCH: 26.4 pg (ref 26.0–34.0)
MCHC: 32.7 g/dL (ref 30.0–36.0)
MCV: 80.8 fL (ref 80.0–100.0)
Platelets: 271 10*3/uL (ref 150–400)
RBC: 5.41 MIL/uL (ref 4.22–5.81)
RDW: 14 % (ref 11.5–15.5)
WBC: 14.2 10*3/uL — ABNORMAL HIGH (ref 4.0–10.5)
nRBC: 0 % (ref 0.0–0.2)

## 2021-09-07 NOTE — ED Triage Notes (Signed)
Pt reports he got off this afternoon and went to eat felt dizzy and nausea, reports started to sweat a lot. Pt reports he has DM II reports was not able to check his blood sugar. Pt reports last meal was around noon. Pt denies passing out, denies any chest pain. Pt talks in complete sentences no distress noted

## 2021-09-08 LAB — CBG MONITORING, ED: Glucose-Capillary: 209 mg/dL — ABNORMAL HIGH (ref 70–99)

## 2021-10-08 ENCOUNTER — Ambulatory Visit: Payer: No Typology Code available for payment source | Admitting: Podiatry

## 2022-03-16 ENCOUNTER — Emergency Department: Payer: Self-pay

## 2022-03-16 ENCOUNTER — Encounter: Payer: Self-pay | Admitting: Emergency Medicine

## 2022-03-16 ENCOUNTER — Other Ambulatory Visit: Payer: Self-pay

## 2022-03-16 ENCOUNTER — Emergency Department
Admission: EM | Admit: 2022-03-16 | Discharge: 2022-03-16 | Disposition: A | Discharge status: Home / Self Care | Payer: Self-pay | Attending: Emergency Medicine | Admitting: Emergency Medicine

## 2022-03-16 DIAGNOSIS — M25511 Pain in right shoulder: Secondary | ICD-10-CM | POA: Insufficient documentation

## 2022-03-16 DIAGNOSIS — R002 Palpitations: Secondary | ICD-10-CM | POA: Insufficient documentation

## 2022-03-16 DIAGNOSIS — E119 Type 2 diabetes mellitus without complications: Secondary | ICD-10-CM | POA: Insufficient documentation

## 2022-03-16 DIAGNOSIS — Z859 Personal history of malignant neoplasm, unspecified: Secondary | ICD-10-CM | POA: Insufficient documentation

## 2022-03-16 LAB — CBC
HCT: 41.8 % (ref 39.0–52.0)
Hemoglobin: 13.5 g/dL (ref 13.0–17.0)
MCH: 25.7 pg — ABNORMAL LOW (ref 26.0–34.0)
MCHC: 32.3 g/dL (ref 30.0–36.0)
MCV: 79.5 fL — ABNORMAL LOW (ref 80.0–100.0)
Platelets: 253 10*3/uL (ref 150–400)
RBC: 5.26 MIL/uL (ref 4.22–5.81)
RDW: 14.1 % (ref 11.5–15.5)
WBC: 13.3 10*3/uL — ABNORMAL HIGH (ref 4.0–10.5)
nRBC: 0 % (ref 0.0–0.2)

## 2022-03-16 LAB — COMPREHENSIVE METABOLIC PANEL
ALT: 20 U/L (ref 0–44)
AST: 15 U/L (ref 15–41)
Albumin: 3.7 g/dL (ref 3.5–5.0)
Alkaline Phosphatase: 52 U/L (ref 38–126)
Anion gap: 7 (ref 5–15)
BUN: 16 mg/dL (ref 6–20)
CO2: 25 mmol/L (ref 22–32)
Calcium: 9.5 mg/dL (ref 8.9–10.3)
Chloride: 103 mmol/L (ref 98–111)
Creatinine, Ser: 1.07 mg/dL (ref 0.61–1.24)
GFR, Estimated: >60 mL/min (ref >60)
Glucose, Bld: 236 mg/dL — ABNORMAL HIGH (ref 70–99)
Potassium: 3.9 mmol/L (ref 3.5–5.1)
Sodium: 135 mmol/L (ref 135–145)
Total Bilirubin: 0.5 mg/dL (ref 0.3–1.2)
Total Protein: 7.7 g/dL (ref 6.5–8.1)

## 2022-03-16 LAB — TROPONIN I (HIGH SENSITIVITY)
Troponin I (High Sensitivity): 6 ng/L (ref <18)
Troponin I (High Sensitivity): 4 ng/L (ref <18)

## 2022-03-16 LAB — LIPASE, BLOOD: Lipase: 42 U/L (ref 11–51)

## 2022-03-16 NOTE — ED Notes (Signed)
Patient resting comfortably on stretcher in room. RR even and unlabored. Patient states he does not feel anxious right now but did when he was at home. Patient is in NSR without ectopy. Patient verbalizes no needs or complaints. ?

## 2022-03-16 NOTE — ED Notes (Signed)
Patient resting comfortably on stretcher. RR even and unlabored. Patient denies any complaints at this time.  ?

## 2022-03-16 NOTE — ED Provider Notes (Signed)
? ?[facility] ?Provider Note ? ? ? Event Date/Time  ? First MD Initiated Contact with Patient 03/16/22 0404   ?  (approximate) ? ? ?History  ? ?Palpitations ? ? ?HPI ? ?Jerome Conner is a 51 y.o. male  with pmh DM who presents with palpitations. Around 130 am pt was trying to sleep when he began experiencing heart palpitations and broke out into a sweat.  Lasted for about 45 minutes which is longer than his typical episodes which is why he is here tonight.  Patient notes that he has these episodes frequently.  Thinks they are related to the stress of his new job.  He denied associated chest pain or dyspnea during the episode no nausea vomiting.  Symptoms were nonexertional.  He has no history of cardiac disease.  The patient denies hx of prior DVT/PE, unilateral leg pain/swelling, hormone use, recent surgery, hx of cancer, prolonged immobilization, or hemoptysis. He is symptom free currently.  ? ?  ? ?Past Medical History:  ?Diagnosis Date  ? Diabetes mellitus without complication (HCC)   ? ? ?Patient Active Problem List  ? Diagnosis Date Noted  ? Acquired hallux limitus of both feet 05/17/2021  ? Type 2 diabetes mellitus (HCC) 10/03/2016  ? ? ? ?Physical Exam  ?Triage Vital Signs: ?ED Triage Vitals  ?Enc Vitals Group  ?   BP 03/16/22 0310 (!) 152/95  ?   Pulse Rate 03/16/22 0310 91  ?   Resp 03/16/22 0310 18  ?   Temp 03/16/22 0310 99 ?F (37.2 ?C)  ?   Temp Source 03/16/22 0310 Oral  ?   SpO2 03/16/22 0310 95 %  ?   Weight 03/16/22 0310 259 lb 4.2 oz (117.6 kg)  ?   Height 03/16/22 0310 5\' 9"  (1.753 m)  ?   Head Circumference --   ?   Peak Flow --   ?   Pain Score 03/16/22 0316 0  ?   Pain Loc --   ?   Pain Edu? --   ?   Excl. in [place]? --   ? ? ?Most recent vital signs: ?Vitals:  ? 03/16/22 0310  ?BP: (!) 152/95  ?Pulse: 91  ?Resp: 18  ?Temp: 99 ?F (37.2 ?C)  ?SpO2: 95%  ? ? ?General: Awake, no distress.  ?CV:  Good peripheral perfusion. No edema  ?Resp:  Normal effort. Lungs are  clear ?Abd:  No distention.  ?Neuro:             Awake, Alert, Oriented x 3  ?Other:   ? ? ?ED Results / Procedures / Treatments  ?Labs ?(all labs ordered are listed, but only abnormal results are displayed) ?Labs Reviewed  ?CBC - Abnormal; Notable for the following components:  ?    Result Value  ? WBC 13.3 (*)   ? MCV 79.5 (*)   ? MCH 25.7 (*)   ? All other components within normal limits  ?COMPREHENSIVE METABOLIC PANEL - Abnormal; Notable for the following components:  ? Glucose, Bld 236 (*)   ? All other components within normal limits  ?LIPASE, BLOOD  ?TROPONIN I (HIGH SENSITIVITY)  ?TROPONIN I (HIGH SENSITIVITY)  ? ? ? ?EKG ? ?EKG interpretation performed by myself: NSR, nml axis, nml intervals, no acute ischemic changes ? ? ? ?RADIOLOGY ?I reviewed the CXR which does not show any acute cardiopulmonary process; agree with radiology report  ? ? ? ?PROCEDURES: ? ?Critical Care performed: No ? ?Procedures ? ?  The patient is on the cardiac monitor to evaluate for evidence of arrhythmia and/or significant heart rate changes. ? ? ?MEDICATIONS ORDERED IN ED: ?Medications - No data to display ? ? ?IMPRESSION / MDM / ASSESSMENT AND PLAN / ED COURSE  ?I reviewed the triage vital signs and the nursing notes. ?             ?               ? ?Differential diagnosis includes, but is not limited to, arrhythmia, panic attack, less likely ACS, pulmonary embolism ? ?Patient is a 51 year old male who presents with an episode of palpitations.  This occurred while he was trying to go to sleep and was associated with diaphoresis no chest pain or shortness of breath.  Has had similar episodes in the past.  Currently he is asymptomatic.  Reviewed his EKG there is no ischemic change.  Vitals are within normal limits.  Initial troponin is negative will repeat as symptoms were less than 3 hours ago.  Ultimately I suspect that this is anxiety related.  Her pulmonary embolism however would not expect symptoms to come and go.  Cardiac  arrhythmia certainly possible and if patient is continuing to have episodes may need to see cardiology for Holter monitoring. ? ?  ? ? ?FINAL CLINICAL IMPRESSION(S) / ED DIAGNOSES  ? ?Final diagnoses:  ?Palpitations  ? ? ? ?Rx / DC Orders  ? ?ED Discharge Orders   ? ? None  ? ?  ? ? ? ?Note:  This document was prepared using Dragon voice recognition software and may include unintentional dictation errors. ?  ?Georga Hacking, MD ?03/16/22 0448 ? ?

## 2022-03-16 NOTE — ED Triage Notes (Signed)
Pt states woke up this morning with palpitations. Pt states could be anxiety. Pt states just started new job. Pt states laid down tonight and could feel his heart pounding and started to sweat. Pt A&O x4, NAD noted in triage.  ? ?Pt also c/o chronic R shoulder pain and is requesting eval for pain, states pain x 1 yr or more.  ?

## 2022-03-16 NOTE — ED Notes (Signed)
Discharge instructions provided to patient with follow-up info. Patient verbalized understanding. Patient ambulated out to the front lobby with a steady gait. ?

## 2022-07-17 ENCOUNTER — Emergency Department: Admission: EM | Admit: 2022-07-17 | Discharge: 2022-07-17 | Discharge status: Absent without leave | Payer: Self-pay

## 2022-11-04 ENCOUNTER — Emergency Department
Admission: EM | Admit: 2022-11-04 | Discharge: 2022-11-04 | Disposition: A | Discharge status: Home / Self Care | Payer: Self-pay | Attending: Student in an Organized Health Care Education/Training Program | Admitting: Student in an Organized Health Care Education/Training Program

## 2022-11-04 ENCOUNTER — Other Ambulatory Visit: Payer: Self-pay

## 2022-11-04 DIAGNOSIS — Z7982 Long term (current) use of aspirin: Secondary | ICD-10-CM | POA: Insufficient documentation

## 2022-11-04 DIAGNOSIS — J069 Acute upper respiratory infection, unspecified: Secondary | ICD-10-CM | POA: Insufficient documentation

## 2022-11-04 DIAGNOSIS — Z1152 Encounter for screening for COVID-19: Secondary | ICD-10-CM | POA: Insufficient documentation

## 2022-11-04 DIAGNOSIS — R051 Acute cough: Secondary | ICD-10-CM

## 2022-11-04 LAB — RESP PANEL BY RT-PCR (FLU A&B, COVID) ARPGX2
Influenza A by PCR: NEGATIVE
Influenza B by PCR: NEGATIVE
SARS Coronavirus 2 by RT PCR: NEGATIVE

## 2022-11-04 MED ORDER — BENZONATATE 100 MG PO CAPS: 200.0000 mg | ORAL_CAPSULE | Freq: Three times a day (TID) | ORAL | 0 refills | Status: DC | PRN

## 2022-11-04 NOTE — ED Provider Notes (Signed)
[facility] EMERGENCY DEPARTMENT Provider Note   CSN: [id number] Arrival date & time: 11/04/22  1207     History  Chief Complaint  Patient presents with   Cough    Jerome Conner is a 51 y.o. male.  Presents to the emergency department for evaluation of cough congestion runny nose x1 day.  Has had a mild headache.  No chest pain or shortness of breath.  He has been without any fevers.  He has taken some over-the-counter medications with some relief.  Denies any body aches, abdominal pain, chest pain or shortness of breath  HPI     Home Medications Prior to Admission medications   Medication Sig Start Date End Date Taking? Authorizing Provider  benzonatate (TESSALON PERLES) 100 MG capsule Take 2 capsules (200 mg total) by mouth 3 (three) times daily as needed for cough. 11/04/22 11/04/23 Yes Duanne Guess, PA-C  albuterol (VENTOLIN HFA) 108 (90 Base) MCG/ACT inhaler Inhale 2 puffs into the lungs every 6 (six) hours as needed for shortness of breath. 01/19/21   Menshew, Dannielle Karvonen, PA-C  aspirin EC 81 MG tablet Take 81 mg by mouth daily.    [provider]  ergocalciferol (VITAMIN D2) 1.25 MG (50000 UT) capsule Take 50,000 Units by mouth once a week.    [provider]  losartan (COZAAR) 25 MG tablet Take 25 mg by mouth daily. Take 1/2 tab daily    [provider]  pantoprazole (PROTONIX) 40 MG tablet Take 1 tablet (40 mg total) by mouth daily. 01/23/20 03/23/20  Gregor Hams, MD  sildenafil (REVATIO) 20 MG tablet sildenafil (pulmonary hypertension) 20 mg tablet  TAKE 1 TABLET BY MOUTH EVERY DAY    [provider]  sitaGLIPtin-metformin (JANUMET) 50-1000 MG tablet Take 1 tablet by mouth 2 (two) times daily with a meal.    [provider]      Allergies    Penicillin g and Penicillins    Review of Systems   Review of Systems  Physical Exam Updated Vital Signs BP (!) 141/95 (BP Location: Left Arm)    Pulse 87   Temp 98.9 F (37.2 C) (Oral)   Resp 18   Ht 5\' 9"  (1.753 m)   Wt 117.6 kg   SpO2 98%   BMI 38.29 kg/m  Physical Exam Constitutional:      Appearance: He is well-developed.  HENT:     Head: Normocephalic and atraumatic.     Right Ear: Ear canal and external ear normal.     Left Ear: Ear canal and external ear normal.     Nose: No congestion or rhinorrhea.     Mouth/Throat:     Mouth: Mucous membranes are dry.     Pharynx: Oropharynx is clear. No oropharyngeal exudate or posterior oropharyngeal erythema.  Eyes:     Conjunctiva/sclera: Conjunctivae normal.  Cardiovascular:     Rate and Rhythm: Normal rate.  Pulmonary:     Effort: Pulmonary effort is normal. No respiratory distress.     Breath sounds: No wheezing or rales.  Abdominal:     General: There is no distension.     Tenderness: There is no abdominal tenderness. There is no guarding.  Musculoskeletal:        General: Normal range of motion.     Cervical back: Normal range of motion.  Skin:    General: Skin is warm.     Findings: No rash.  Neurological:  General: No focal deficit present.     Mental Status: He is alert and oriented to person, place, and time. Mental status is at baseline.  Psychiatric:        Behavior: Behavior normal.        Thought Content: Thought content normal.     ED Results / Procedures / Treatments   Labs (all labs ordered are listed, but only abnormal results are displayed) Labs Reviewed  RESP PANEL BY RT-PCR (FLU A&B, COVID) ARPGX2    EKG None  Radiology No results found.  Procedures Procedures    Medications Ordered in ED Medications - No data to display  ED Course/ Medical Decision Making/ A&P                           Medical Decision Making Risk Prescription drug management.  51 year old male with 1 day of cough congestion runny nose.  He is afebrile.  Vital signs are stable.  Flu and COVID test negative.  Patient will take over-the-counter  medications, he is given cough medication.  He understands signs and symptoms return to the ER for. Final Clinical Impression(s) / ED Diagnoses Final diagnoses:  Acute cough  Viral URI with cough    Rx / DC Orders ED Discharge Orders          Ordered    benzonatate (TESSALON PERLES) 100 MG capsule  3 times daily PRN        11/04/22 1551              Ronnette Juniper 11/04/22 1556    Willy Eddy, MD 11/04/22 1719

## 2022-11-04 NOTE — Discharge Instructions (Signed)
Please alternate Tylenol and ibuprofen as needed for any fevers, chills or body aches.  Take cough medication as prescribed.  You may continue with over-the-counter cold medication.  Return to the ER for any fevers above 101, worsening cough, shortness of breath or any urgent changes in your health

## 2022-11-04 NOTE — ED Triage Notes (Signed)
C/O cough and congestion x 1 day.  AAOx3.  Skin warm and dry. NAD

## 2022-12-04 ENCOUNTER — Encounter: Payer: Self-pay | Admitting: Emergency Medicine

## 2022-12-04 ENCOUNTER — Emergency Department: Payer: Self-pay

## 2022-12-04 ENCOUNTER — Other Ambulatory Visit: Payer: Self-pay

## 2022-12-04 ENCOUNTER — Emergency Department
Admission: EM | Admit: 2022-12-04 | Discharge: 2022-12-04 | Disposition: A | Discharge status: Home / Self Care | Payer: Self-pay | Attending: Emergency Medicine | Admitting: Emergency Medicine

## 2022-12-04 DIAGNOSIS — Z20822 Contact with and (suspected) exposure to covid-19: Secondary | ICD-10-CM | POA: Insufficient documentation

## 2022-12-04 DIAGNOSIS — J40 Bronchitis, not specified as acute or chronic: Secondary | ICD-10-CM | POA: Insufficient documentation

## 2022-12-04 DIAGNOSIS — E119 Type 2 diabetes mellitus without complications: Secondary | ICD-10-CM | POA: Insufficient documentation

## 2022-12-04 LAB — RESP PANEL BY RT-PCR (RSV, FLU A&B, COVID)  RVPGX2
Influenza A by PCR: NEGATIVE
Influenza B by PCR: NEGATIVE
Resp Syncytial Virus by PCR: NEGATIVE
SARS Coronavirus 2 by RT PCR: NEGATIVE

## 2022-12-04 MED ORDER — GUAIFENESIN ER 600 MG PO TB12: 600.0000 mg | ORAL_TABLET | Freq: Two times a day (BID) | ORAL | 0 refills | Status: AC

## 2022-12-04 MED ORDER — ALBUTEROL SULFATE HFA 108 (90 BASE) MCG/ACT IN AERS: 2.0000 | INHALATION_SPRAY | Freq: Four times a day (QID) | RESPIRATORY_TRACT | 0 refills | Status: DC | PRN

## 2022-12-04 MED ORDER — AEROCHAMBER PLUS FLO-VU SMALL MISC: 1.0000 | Freq: Once | 0 refills | Status: AC

## 2022-12-04 NOTE — ED Provider Notes (Signed)
[facility] Provider Note    Event Date/Time   First MD Initiated Contact with Patient 12/04/22 [phone]     (approximate)   History   Chief Complaint Cough   HPI  Jerome Conner is a 52 y.o. male with past medical history of diabetes who presents to the ED complaining of cough.  Patient reports that he has had 2 days of cough productive of greenish sputum along with subjective fevers and chills.  He denies any difficulty breathing or pain in his chest, has been eating and drinking normally with no nausea or vomiting.  He is not aware of any sick contacts.     Physical Exam   Triage Vital Signs: ED Triage Vitals [12/04/22 0551]  Enc Vitals Group     BP (!) 144/80     Pulse Rate 83     Resp 18     Temp 98.5 F (36.9 C)     Temp Source Oral     SpO2 98 %     Weight 255 lb (115.7 kg)     Height 5\' 9"  (1.753 m)     Head Circumference      Peak Flow      Pain Score      Pain Loc      Pain Edu?      Excl. in [place]?     Most recent vital signs: Vitals:   12/04/22 0551  BP: (!) 144/80  Pulse: 83  Resp: 18  Temp: 98.5 F (36.9 C)  SpO2: 98%    Constitutional: Alert and oriented. Eyes: Conjunctivae are normal. Head: Atraumatic. Nose: No congestion/rhinnorhea. Mouth/Throat: Mucous membranes are moist.  Cardiovascular: Normal rate, regular rhythm. Grossly normal heart sounds.  2+ radial pulses bilaterally. Respiratory: Normal respiratory effort.  No retractions. Lungs CTAB. Gastrointestinal: Soft and nontender. No distention. Musculoskeletal: No lower extremity tenderness nor edema.  Neurologic:  Normal speech and language. No gross focal neurologic deficits are appreciated.    ED Results / Procedures / Treatments   Labs (all labs ordered are listed, but only abnormal results are displayed) Labs Reviewed  RESP PANEL BY RT-PCR (RSV, FLU A&B, COVID)  RVPGX2   RADIOLOGY Chest x-ray reviewed and interpreted by me with no infiltrate,  edema, or effusion.  PROCEDURES:  Critical Care performed: No  Procedures   MEDICATIONS ORDERED IN ED: Medications - No data to display   IMPRESSION / MDM / [place] / ED COURSE  I reviewed the triage vital signs and the nursing notes.                              52 y.o. male with past medical history of diabetes presents to the ED complaining of 2 days of productive cough along with subjective fevers and chills.  Patient's presentation is most consistent with acute illness / injury with system symptoms.  Differential diagnosis includes, but is not limited to, pneumonia, bronchitis, COVID-19, influenza, asthma, COPD.  Patient well-appearing and in no acute distress, vital signs are unremarkable.  His lungs are clear to auscultation bilaterally and he is maintaining oxygen saturations at 98% on room air.  Chest x-ray is unremarkable, testing for COVID-19 and influenza is negative.  Suspect viral bronchitis and patient is appropriate for outpatient management with albuterol and Mucinex.  He was counseled to return to the ED for new or worsening symptoms, patient agrees with plan.  FINAL CLINICAL IMPRESSION(S) / ED DIAGNOSES   Final diagnoses:  Bronchitis     Rx / DC Orders   ED Discharge Orders          Ordered    albuterol (VENTOLIN HFA) 108 (90 Base) MCG/ACT inhaler  Every 6 hours PRN       Note to Pharmacy: Please supply with spacer   12/04/22 0811    guaiFENesin (MUCINEX) 600 MG 12 hr tablet  2 times daily        12/04/22 1610             Note:  This document was prepared using Dragon voice recognition software and may include unintentional dictation errors.   [name], MD 12/04/22 [phone]

## 2022-12-04 NOTE — ED Notes (Signed)
Patient discharged to home per MD order. Patient in stable condition, and deemed medically cleared by ED provider for discharge. Discharge instructions reviewed with patient/family using "Teach Back"; verbalized understanding of medication education and administration, and information about follow-up care. Denies further concerns. ° °

## 2022-12-04 NOTE — ED Triage Notes (Signed)
Patient ambulatory to triage with steady gait, without difficulty or distress noted; pt reports prod cough green sputum, runny nose, congestion & fever x 2 days

## 2023-01-27 ENCOUNTER — Emergency Department
Admission: EM | Admit: 2023-01-27 | Discharge: 2023-01-27 | Disposition: A | Discharge status: Home / Self Care | Payer: Self-pay | Attending: Emergency Medicine | Admitting: Emergency Medicine

## 2023-01-27 ENCOUNTER — Encounter: Payer: Self-pay | Admitting: Emergency Medicine

## 2023-01-27 ENCOUNTER — Other Ambulatory Visit: Payer: Self-pay

## 2023-01-27 DIAGNOSIS — U071 COVID-19: Secondary | ICD-10-CM | POA: Insufficient documentation

## 2023-01-27 LAB — RESP PANEL BY RT-PCR (RSV, FLU A&B, COVID)  RVPGX2
Influenza A by PCR: NEGATIVE
Influenza B by PCR: NEGATIVE
Resp Syncytial Virus by PCR: NEGATIVE
SARS Coronavirus 2 by RT PCR: POSITIVE — AB

## 2023-01-27 MED ORDER — ALBUTEROL SULFATE HFA 108 (90 BASE) MCG/ACT IN AERS: 2.0000 | INHALATION_SPRAY | Freq: Four times a day (QID) | RESPIRATORY_TRACT | 0 refills | Status: AC | PRN

## 2023-01-27 MED ORDER — BENZONATATE 100 MG PO CAPS: 200.0000 mg | ORAL_CAPSULE | Freq: Three times a day (TID) | ORAL | 0 refills | Status: AC | PRN

## 2023-01-27 MED ORDER — NIRMATRELVIR/RITONAVIR (PAXLOVID)TABLET
3.0000 | ORAL_TABLET | Freq: Two times a day (BID) | ORAL | 0 refills | Status: AC
Start: 2023-01-27 — End: 2023-02-01

## 2023-01-27 NOTE — ED Provider Notes (Signed)
[facility] Emergency Department Provider Note     Event Date/Time   First MD Initiated Contact with Patient 01/27/23 1716     (approximate)   History   Cough   HPI  Jerome Conner is a 52 y.o. male resents to the ED for evaluation of headaches, chills, cough, congestion.  Patient reports symptoms since Saturday over the weekend.  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  He has been taking OTC cough and cold medicine with limited benefit.   Physical Exam   Triage Vital Signs: ED Triage Vitals  Enc Vitals Group     BP 01/27/23 1603 (!) 136/104     Pulse Rate 01/27/23 1603 (!) 106     Resp 01/27/23 1603 16     Temp 01/27/23 1603 99.4 F (37.4 C)     Temp Source 01/27/23 1603 Oral     SpO2 01/27/23 1603 97 %     Weight 01/27/23 1605 255 lb (115.7 kg)     Height 01/27/23 1605 '5\' 9"'$  (1.753 m)     Head Circumference --      Peak Flow --      Pain Score 01/27/23 1605 0     Pain Loc --      Pain Edu? --      Excl. in [place]? --     Most recent vital signs: Vitals:   01/27/23 1603  BP: (!) 136/104  Pulse: (!) 106  Resp: 16  Temp: 99.4 F (37.4 C)  SpO2: 97%    General Awake, no distress. NAD HEENT NCAT. PERRL. EOMI. No rhinorrhea. Mucous membranes are moist.  TMs intact bilaterally. CV:  Good peripheral perfusion.  RESP:  Normal effort.  ABD:  No distention.     ED Results / Procedures / Treatments   Labs (all labs ordered are listed, but only abnormal results are displayed) Labs Reviewed  RESP PANEL BY RT-PCR (RSV, FLU A&B, COVID)  RVPGX2 - Abnormal; Notable for the following components:      Result Value   SARS Coronavirus 2 by RT PCR POSITIVE (*)    All other components within normal limits     EKG   RADIOLOGY  No results found.   PROCEDURES:  Critical Care performed: No  Procedures   MEDICATIONS ORDERED IN ED: Medications - No data to display   IMPRESSION / MDM / [place] / ED COURSE  I reviewed the triage vital signs and the nursing notes.                              Differential diagnosis includes, but is not limited to,, flu, RSV, viral URI, AOM  Patient's presentation is most consistent with acute presentation with potential threat to life or bodily function.  Patient's diagnosis is consistent with COVID as confirmed by his PCR. Patient will be discharged home with prescriptions for albuterol inhaler, Tessalon Perles, and Paxlovid. Patient is to follow up with his primary provider as needed or otherwise directed. Patient is given ED precautions to return to the ED for any worsening or new symptoms.  FINAL CLINICAL IMPRESSION(S) / ED DIAGNOSES   Final diagnoses:  COVID-19     Rx / DC Orders   ED Discharge Orders          Ordered    albuterol (VENTOLIN HFA) 108 (90 Base) MCG/ACT inhaler  Every 6 hours  PRN       Note to Pharmacy: Please supply with spacer   01/27/23 1823    benzonatate (TESSALON PERLES) 100 MG capsule  3 times daily PRN        01/27/23 1823    nirmatrelvir/ritonavir (PAXLOVID) 20 x 150 MG & 10 x '100MG'$  TABS  2 times daily        01/27/23 1823             Note:  This document was prepared using Dragon voice recognition software and may include unintentional dictation errors.    Melvenia Needles, PA-C 01/28/23 Marlowe Aschoff, MD 01/29/23 [phone]

## 2023-01-27 NOTE — Discharge Instructions (Addendum)
Your exam and tests confirm Covid. Take the prescription meds as directed. Rest and hydrate and remain quarantined for the next 5 days. Follow-up with your primary care provider as needed.

## 2023-01-27 NOTE — ED Triage Notes (Signed)
Pt endorses headache, chills, cough and congestion since Saturday morning. 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

## 2023-04-19 IMAGING — CR DG CHEST 2V
1 series · 2 of 2 positions shown · non-contrast
Comparison: February 21, 2021

CLINICAL DATA: Palpitations and chronic right shoulder pain.

EXAM:
CHEST - 2 VIEW

[Series 1: dg chest 2 view · 0.14mm/px · 2 of 2 slices shown]
[im 1/2]
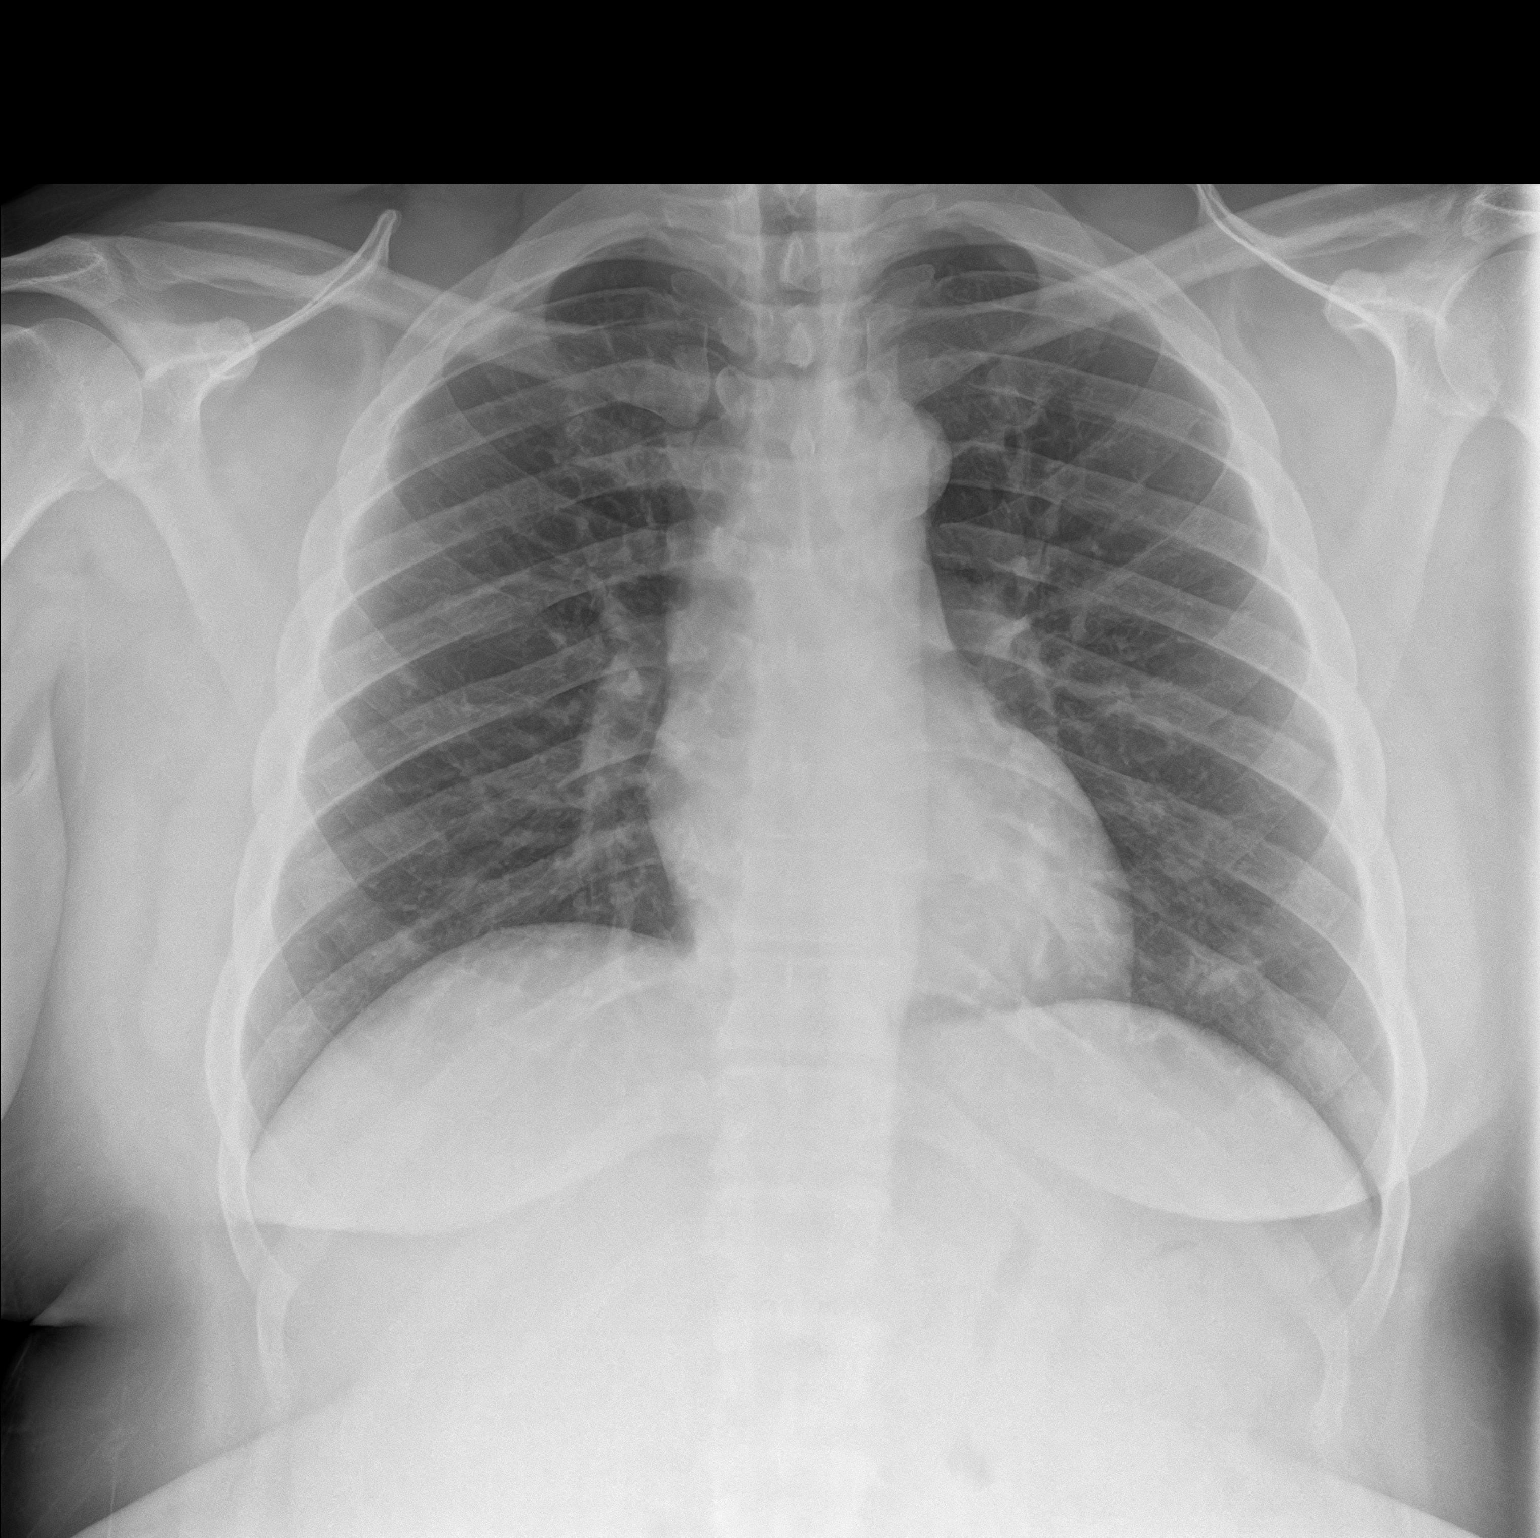
[im 2/2]
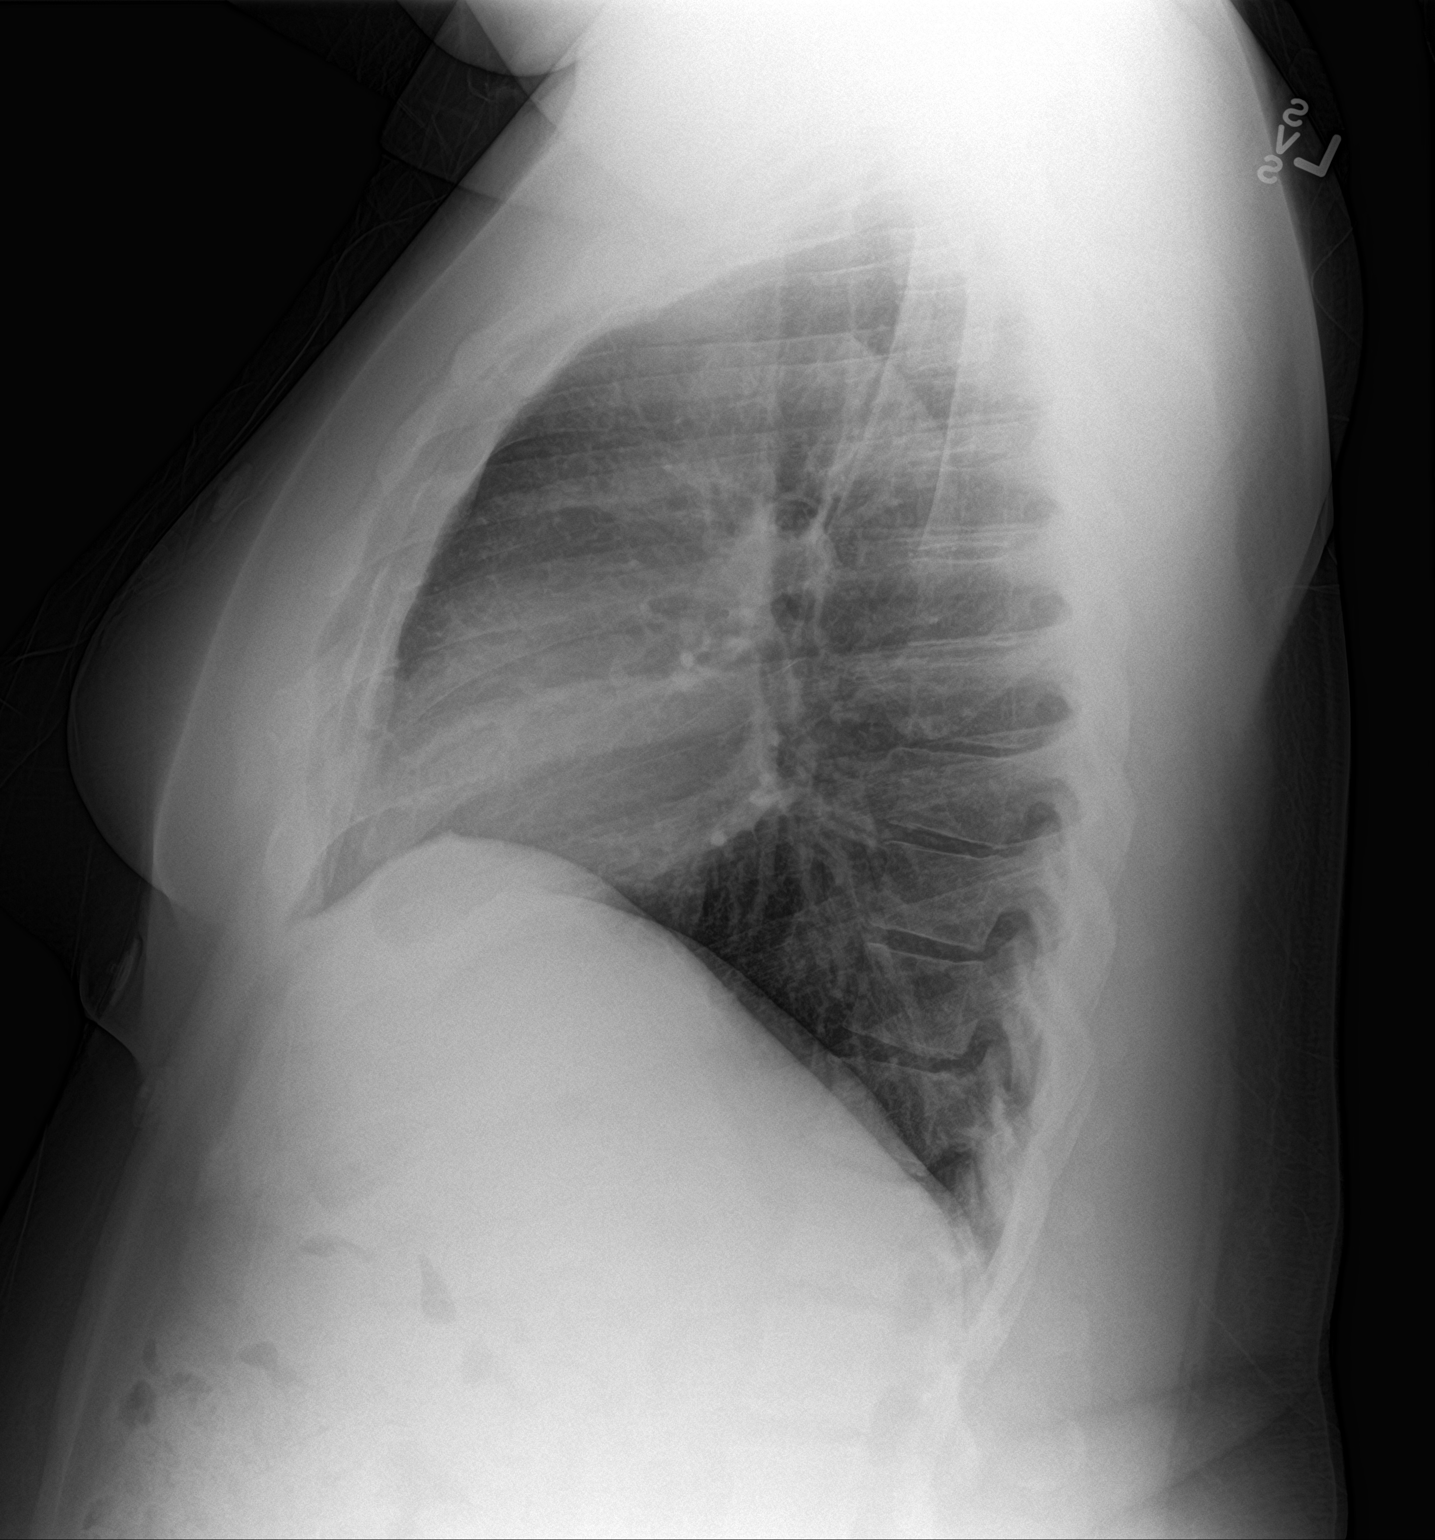

[2 of 2 positions shown; findings below may reference images not displayed]

FINDINGS: The heart size and mediastinal contours are within normal limits.
Both lungs are clear. The visualized skeletal structures are
unremarkable.
IMPRESSION: No active cardiopulmonary disease.

## 2023-04-19 IMAGING — CR DG SHOULDER 2+V*R*
1 series · 3 of 3 positions shown · non-contrast
Comparison: None.

CLINICAL DATA: Right shoulder pain.

EXAM:
RIGHT SHOULDER - 2+ VIEW

[Series 1: dg shoulder right · 0.14mm/px · 3 of 3 slices shown]
[im 1/3]
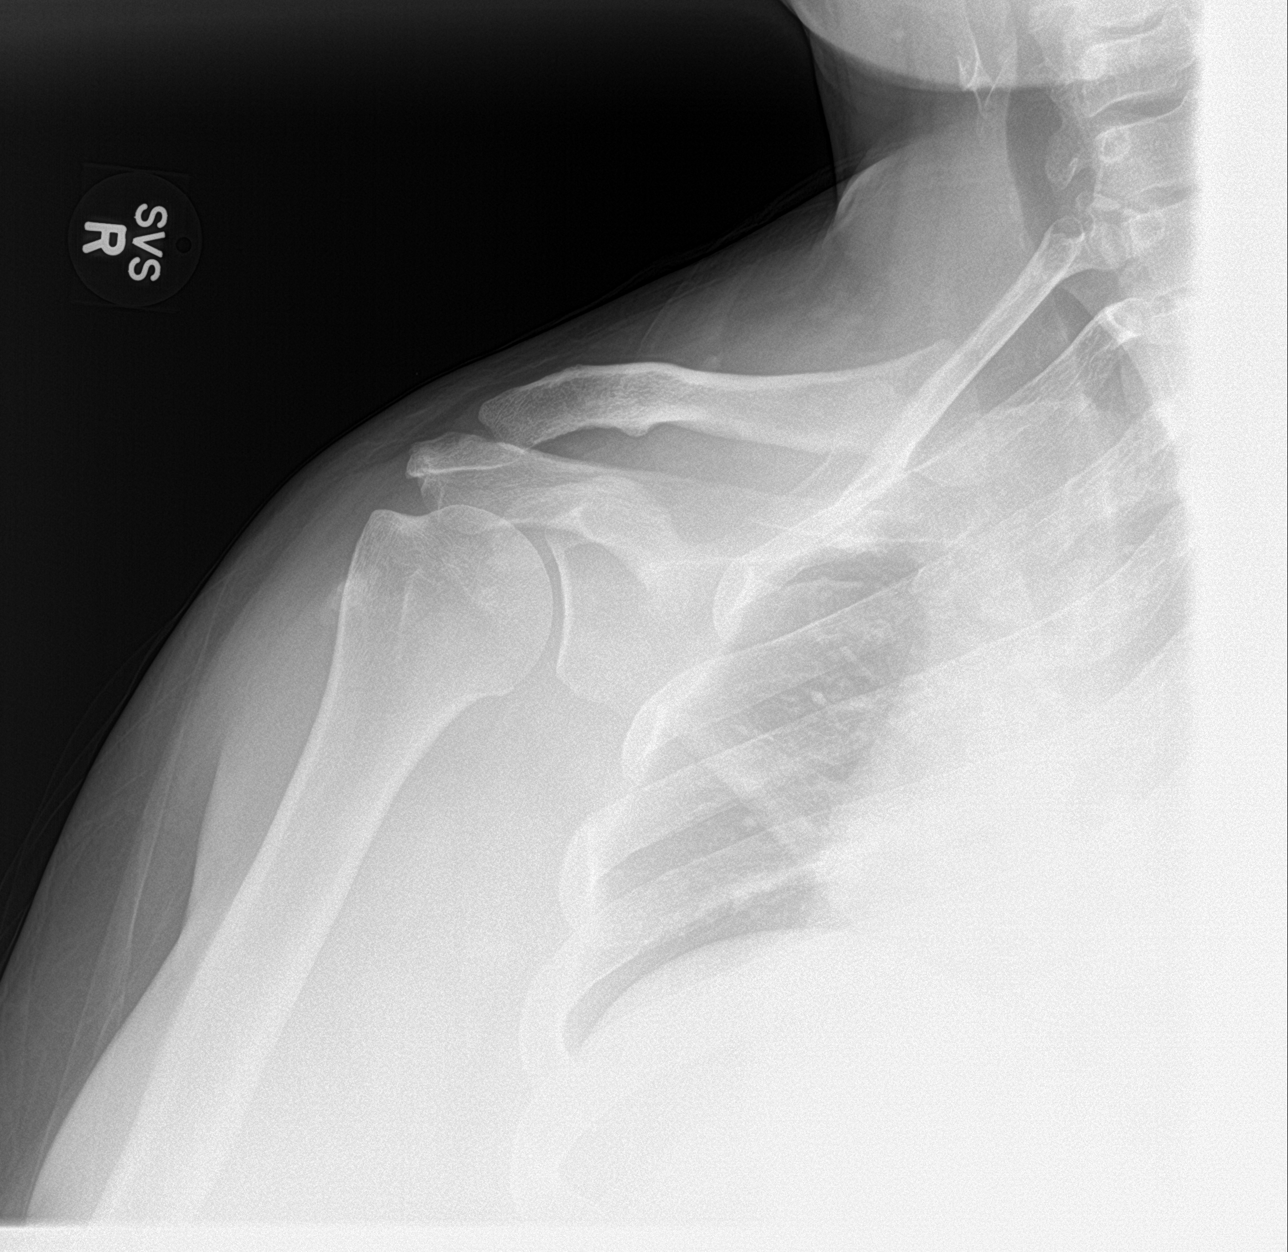
[im 2/3]
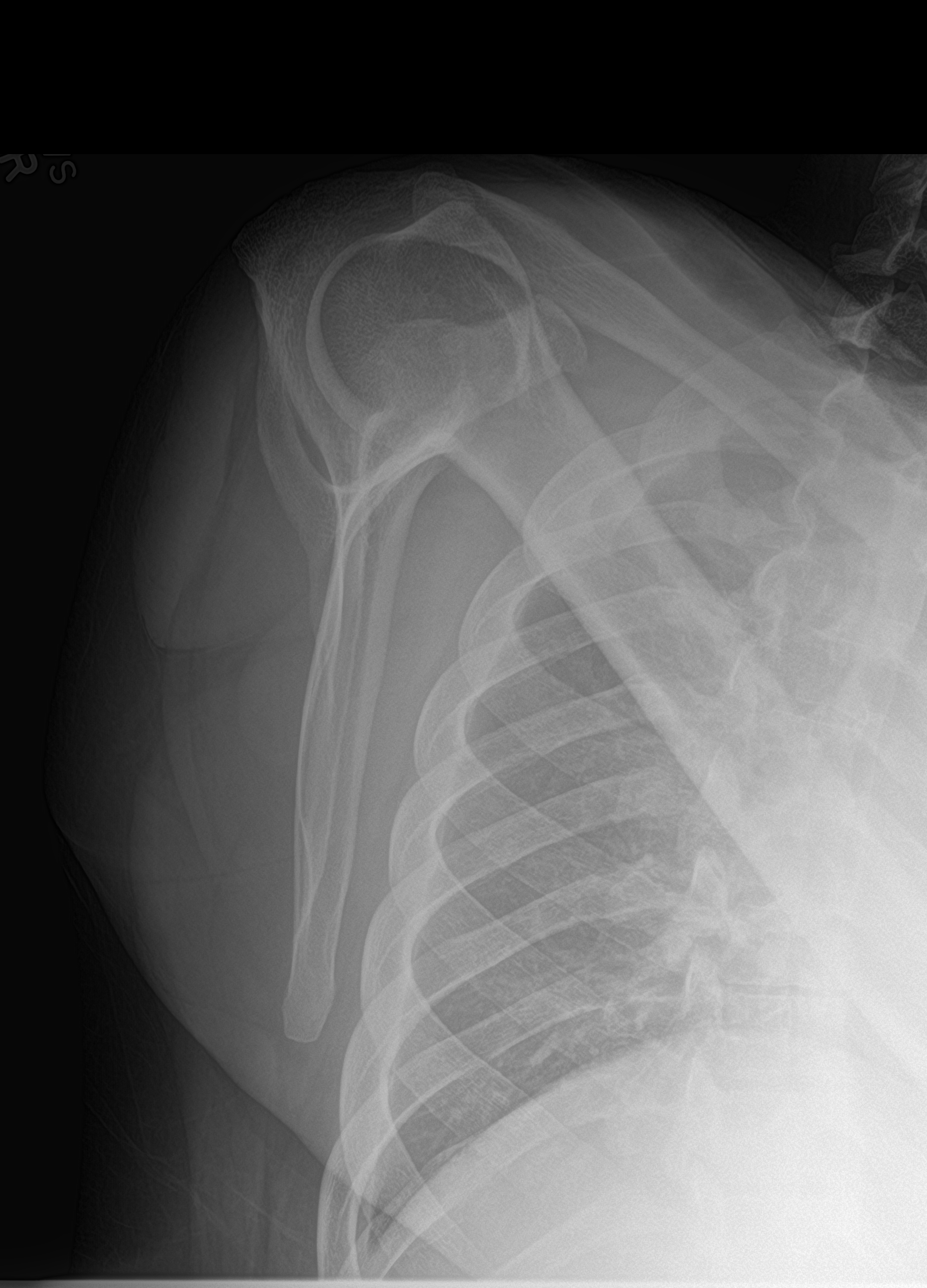
[im 3/3]
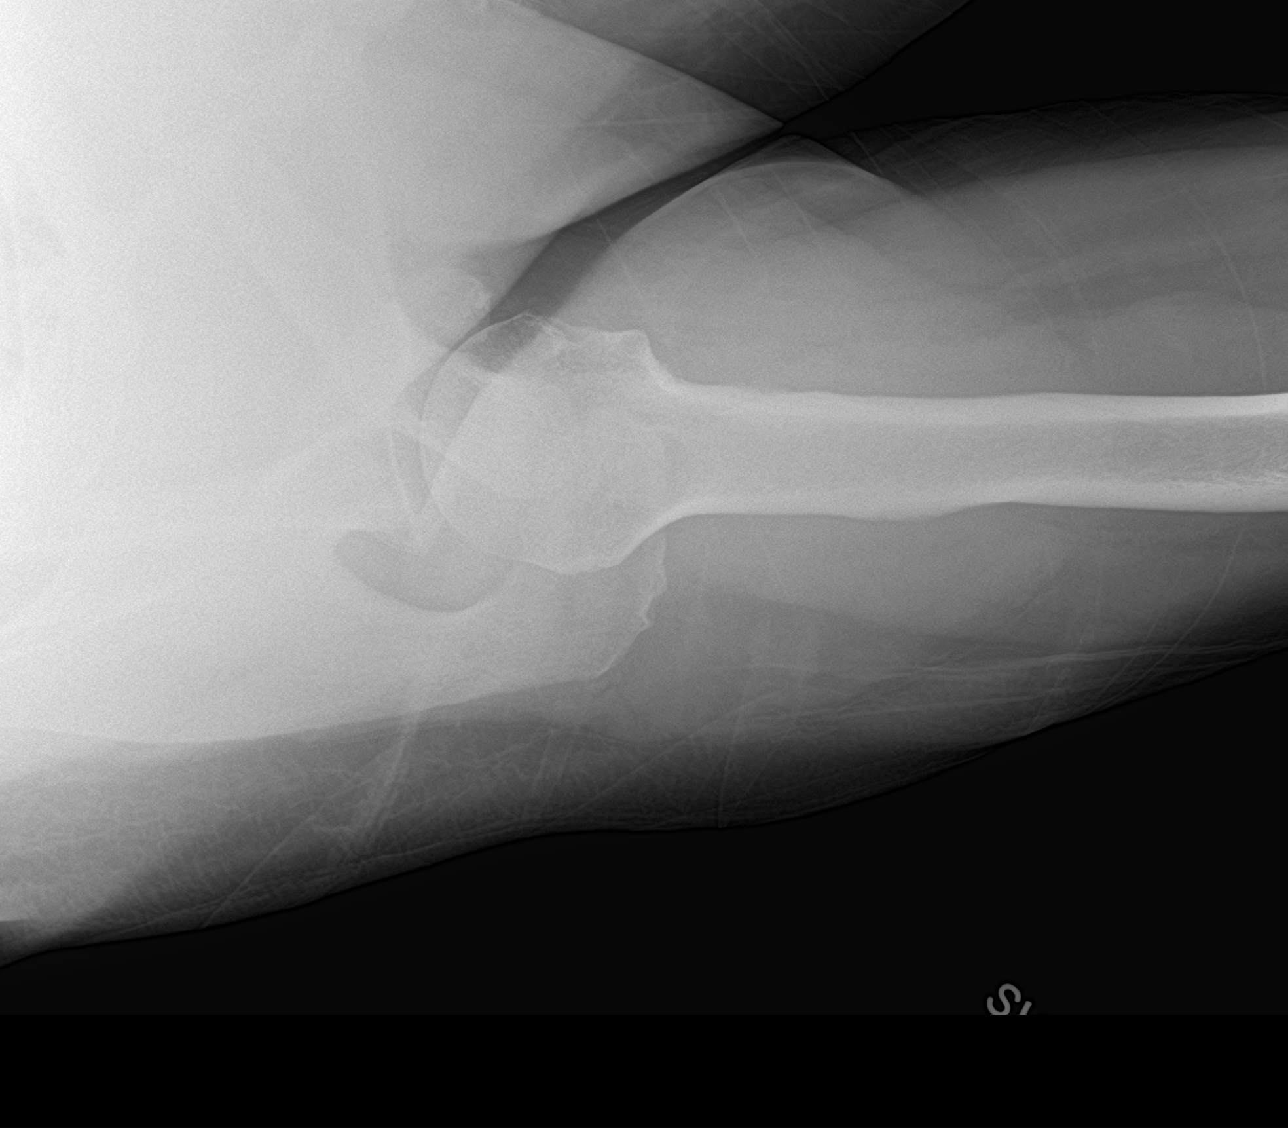

[3 of 3 positions shown; findings below may reference images not displayed]

FINDINGS: There is no evidence of fracture or dislocation. There is no
evidence of arthropathy or other focal bone abnormality. Soft
tissues are unremarkable.
IMPRESSION: Negative.

## 2024-11-04 ENCOUNTER — Other Ambulatory Visit: Payer: Self-pay

## 2024-11-04 ENCOUNTER — Emergency Department: Admission: EM | Admit: 2024-11-04 | Discharge: 2024-11-04 | Disposition: A | Discharge status: Home / Self Care

## 2024-11-04 ENCOUNTER — Emergency Department

## 2024-11-04 ENCOUNTER — Encounter: Payer: Self-pay | Admitting: Emergency Medicine

## 2024-11-04 DIAGNOSIS — Z7984 Long term (current) use of oral hypoglycemic drugs: Secondary | ICD-10-CM | POA: Diagnosis not present

## 2024-11-04 DIAGNOSIS — E119 Type 2 diabetes mellitus without complications: Secondary | ICD-10-CM | POA: Insufficient documentation

## 2024-11-04 DIAGNOSIS — R0981 Nasal congestion: Secondary | ICD-10-CM | POA: Diagnosis present

## 2024-11-04 DIAGNOSIS — D72829 Elevated white blood cell count, unspecified: Secondary | ICD-10-CM | POA: Insufficient documentation

## 2024-11-04 DIAGNOSIS — U071 COVID-19: Secondary | ICD-10-CM | POA: Diagnosis not present

## 2024-11-04 DIAGNOSIS — R52 Pain, unspecified: Secondary | ICD-10-CM

## 2024-11-04 DIAGNOSIS — I10 Essential (primary) hypertension: Secondary | ICD-10-CM | POA: Diagnosis not present

## 2024-11-04 DIAGNOSIS — R059 Cough, unspecified: Secondary | ICD-10-CM

## 2024-11-04 LAB — BASIC METABOLIC PANEL WITH GFR
Anion gap: 10 (ref 5–15)
BUN: 11 mg/dL (ref 6–20)
CO2: 26 mmol/L (ref 22–32)
Calcium: 9.4 mg/dL (ref 8.9–10.3)
Chloride: 101 mmol/L (ref 98–111)
Creatinine, Ser: 1.02 mg/dL (ref 0.61–1.24)
GFR, Estimated: >60 mL/min (ref >60)
Glucose, Bld: 147 mg/dL — ABNORMAL HIGH (ref 70–99)
Potassium: 3.8 mmol/L (ref 3.5–5.1)
Sodium: 137 mmol/L (ref 135–145)

## 2024-11-04 LAB — CBC
HCT: 46 % (ref 39.0–52.0)
Hemoglobin: 15.2 g/dL (ref 13.0–17.0)
MCH: 26.6 pg (ref 26.0–34.0)
MCHC: 33 g/dL (ref 30.0–36.0)
MCV: 80.4 fL (ref 80.0–100.0)
Platelets: 229 K/uL (ref 150–400)
RBC: 5.72 MIL/uL (ref 4.22–5.81)
RDW: 14.2 % (ref 11.5–15.5)
WBC: 11.8 K/uL — ABNORMAL HIGH (ref 4.0–10.5)
nRBC: 0 % (ref 0.0–0.2)

## 2024-11-04 LAB — RESP PANEL BY RT-PCR (RSV, FLU A&B, COVID)  RVPGX2
Influenza A by PCR: NEGATIVE
Influenza B by PCR: NEGATIVE
Resp Syncytial Virus by PCR: NEGATIVE
SARS Coronavirus 2 by RT PCR: POSITIVE — AB

## 2024-11-04 LAB — CBG MONITORING, ED: Glucose-Capillary: 179 mg/dL — ABNORMAL HIGH (ref 70–99)

## 2024-11-04 LAB — LACTIC ACID, PLASMA: Lactic Acid, Venous: 1.8 mmol/L (ref 0.5–1.9)

## 2024-11-04 MED ORDER — OXYMETAZOLINE HCL 0.05 % NA SOLN
1.0000 | Freq: Once | NASAL | Status: AC
  Administered 2024-11-04: 1 via NASAL
  Filled 2024-11-04: qty 30

## 2024-11-04 MED ORDER — FAMOTIDINE IN NACL 20-0.9 MG/50ML-% IV SOLN
20.0000 mg | Freq: Once | INTRAVENOUS | Status: AC
  Administered 2024-11-04: 20 mg via INTRAVENOUS
  Filled 2024-11-04: qty 50

## 2024-11-04 MED ORDER — SALINE SPRAY 0.65 % NA SOLN
1.0000 | Freq: Once | NASAL | Status: DC
  Filled 2024-11-04: qty 44

## 2024-11-04 MED ORDER — SODIUM CHLORIDE 0.9 % IV BOLUS
1000.0000 mL | Freq: Once | INTRAVENOUS | Status: AC
  Administered 2024-11-04: 1000 mL via INTRAVENOUS

## 2024-11-04 MED ORDER — KETOROLAC TROMETHAMINE 15 MG/ML IJ SOLN
15.0000 mg | Freq: Once | INTRAMUSCULAR | Status: AC
  Administered 2024-11-04: 15 mg via INTRAVENOUS
  Filled 2024-11-04: qty 1

## 2024-11-04 MED ORDER — KETOROLAC TROMETHAMINE 10 MG PO TABS: 10.0000 mg | ORAL_TABLET | Freq: Four times a day (QID) | ORAL | 0 refills | Status: AC | PRN

## 2024-11-04 MED ORDER — ACETAMINOPHEN 325 MG PO TABS
650.0000 mg | ORAL_TABLET | Freq: Once | ORAL | Status: AC
  Administered 2024-11-04: 650 mg via ORAL
  Filled 2024-11-04: qty 2

## 2024-11-04 MED ORDER — ONDANSETRON 4 MG PO TBDP: 4.0000 mg | ORAL_TABLET | Freq: Three times a day (TID) | ORAL | 0 refills | Status: AC | PRN

## 2024-11-04 NOTE — ED Triage Notes (Signed)
 Pt via POV from home. Pt c/o cough, congestion, and burning in my lungs for the past couple of days reports he feels like he has COVID. Also reports he is unable to keep his blood sugar up. Pt states he feels like its low because he can't focus. Pt does not check his sugar but states he has a hx of DM II and takes oral medications as prescribed. Pt is A&Ox4 and NAD

## 2024-11-04 NOTE — ED Notes (Signed)
Pt given a sandwich tray ?

## 2024-11-04 NOTE — Discharge Instructions (Signed)
 1) at most only used Afrin twice a day for 3 days as there was a rebound effect 2) rest and ensure adequate hydration.  Fluids are more important than solids 3) return with any acutely worsening symptoms or any other emergency -- RETURN PRECAUTIONS & AFTERCARE: (ENGLISH) RETURN PRECAUTIONS: Return immediately to the emergency department or see/call your doctor if you feel worse, weak or have changes in speech or vision, are short of breath, have fever, vomiting, pain, bleeding or dark stool, trouble urinating or any new issues. Return here or see/call your doctor if not improving as expected for your suspected condition. FOLLOW-UP CARE: Call your doctor and/or any doctors we referred you to for more advice and to make an appointment. Do this today, tomorrow or after the weekend. Some doctors only take PPO insurance so if you have HMO insurance you may want to contact your HMO or your regular doctor for referral to a specialist within your plan. Either way tell the doctor's office that it was a referral from the emergency department so you get the soonest possible appointment.  YOUR TEST RESULTS: Take result reports of any blood or urine tests, imaging tests and EKG's to your doctor and any referral doctor. Have any abnormal tests repeated. Your doctor or a referral doctor can let you know when this should be done. Also make sure your doctor contacts this hospital to get any test results that are not currently available such as cultures or special tests for infection and final imaging reports, which are often not available at the time you leave the ER but which may list additional important findings that are not documented on the preliminary report. BLOOD PRESSURE: If your blood pressure was greater than 120/80 have your blood pressure rechecked within 1 to 2 weeks. MEDICATION SIDE EFFECTS: Do not drive, walk, bike, take the bus, etc. if you have received or are being prescribed any sedating medications such  as those for pain or anxiety or certain antihistamines like Benadryl. If you have been give one of these here get a taxi home or have a friend drive you home. Ask your pharmacist to counsel you on potential side effects of any new medication

## 2024-11-04 NOTE — ED Notes (Signed)
 Called lab to check on resp swab, pt claims he was swabbed in triage

## 2024-11-04 NOTE — ED Provider Notes (Signed)
 [facility] Provider Note    Event Date/Time   First MD Initiated Contact with Patient 11/04/24 1454     (approximate)   History   No chief complaint on file.   HPI  Jerome Conner is a 53 y.o. male with type 2 diabetes on glipizide, high blood pressure who follows with a primary care physician regularly and intermittently uses [employer]C Gummies who presents to the emergency department with 24 hours of worsening nasal congestion, cough, general malaise.  Patient states that his symptoms kept him from sleeping overnight and he is worried that he may have COVID.  He was worried that his glucose was low yesterday so states that he ate a large amount of sugary treats today and attempt to raise his sugar.  He feels as if his lungs on fire.  Denies any smoking.  Denies any chest pain abdominal pain changes in urinary or bowel habits      Physical Exam   Triage Vital Signs: ED Triage Vitals  Encounter Vitals Group     BP 11/04/24 1406 (!) 158/100     Girls Systolic BP Percentile --      Girls Diastolic BP Percentile --      Boys Systolic BP Percentile --      Boys Diastolic BP Percentile --      Pulse Rate 11/04/24 1406 (!) 130     Resp 11/04/24 1406 18     Temp 11/04/24 1409 100.2 F (37.9 C)     Temp Source 11/04/24 1409 Oral     SpO2 11/04/24 1406 100 %     Weight 11/04/24 1403 235 lb (106.6 kg)     Height 11/04/24 1403 5' 9 (1.753 m)     Head Circumference --      Peak Flow --      Pain Score 11/04/24 1402 0     Pain Loc --      Pain Education --      Exclude from Growth Chart --     Most recent vital signs: Vitals:   11/04/24 1530 11/04/24 1735  BP: 139/73 135/80  Pulse: (!) 114 (!) 105  Resp: 16 17  Temp:    SpO2: 99% 98%    Nursing Triage Note reviewed. Vital signs reviewed and patients oxygen saturation is normoxic  General: Patient is well nourished, well developed, awake and alert, resting comfortably in no acute distress Head:  Normocephalic and atraumatic Eyes: Normal inspection, extraocular muscles intact, no conjunctival pallor Ear, nose, throat: Normal external exam, + nasal congestion Neck: Normal range of motion Respiratory: Patient is in no respiratory distress, lungs CTAB Cardiovascular: Patient is tachycardic, RRR without murmur appreciated GI: Abd SNT with no guarding or rebound  Back: Normal inspection of the back with good strength and range of motion throughout all ext Extremities: pulses intact with good cap refills, no LE pitting edema or calf tenderness Neuro: The patient is alert and oriented to person, place, and time, appropriately conversive, with 5/5 bilat UE/LE strength, no gross motor or sensory defects noted. Coordination appears to be adequate. Skin: Warm, dry, and intact Psych: normal mood and affect, no SI or HI  ED Results / Procedures / Treatments   Labs (all labs ordered are listed, but only abnormal results are displayed) Labs Reviewed  RESP PANEL BY RT-PCR (RSV, FLU A&B, COVID)  [id number] - Abnormal; Notable for the following components:      Result Value   SARS Coronavirus 2  by RT PCR POSITIVE (*)    All other components within normal limits  BASIC METABOLIC PANEL WITH GFR - Abnormal; Notable for the following components:   Glucose, Bld 147 (*)    All other components within normal limits  CBC - Abnormal; Notable for the following components:   WBC 11.8 (*)    All other components within normal limits  CBG MONITORING, ED - Abnormal; Notable for the following components:   Glucose-Capillary 179 (*)    All other components within normal limits  LACTIC ACID, PLASMA  CBG MONITORING, ED     EKG EKG and rhythm strip are interpreted by myself:   EKG: tachycardic sinus rhythm] at heart rate of 131, normal QRS duration, QTc 410, nonspecific ST segments and T waves no ectopy EKG not consistent with Acute STEMI Rhythm strip: tachycardic in lead II   RADIOLOGY Xray Chest: No  acute abnormality on my independent review interpretation    PROCEDURES:  Critical Care performed: No  Procedures   MEDICATIONS ORDERED IN ED: Medications  ketorolac  (TORADOL ) 15 MG/ML injection 15 mg (15 mg Intravenous Given 11/04/24 1515)  sodium chloride  0.9 % bolus 1,000 mL (0 mLs Intravenous Stopped 11/04/24 1649)  oxymetazoline  (AFRIN) 0.05 % nasal spray 1 spray (1 spray Each Nare Given 11/04/24 1516)  acetaminophen  (TYLENOL ) tablet 650 mg (650 mg Oral Given 11/04/24 1516)  famotidine  (PEPCID ) IVPB 20 mg premix (0 mg Intravenous Stopped 11/04/24 1547)     IMPRESSION / MDM / ASSESSMENT AND PLAN / ED COURSE                                Differential diagnosis includes, but is not limited to, upper respiratory infection, pneumonia, electrolyte derangement, anemia   ED course: Patient is well-appearing however he is tachycardic and borderline febrile.  He is satting 100% on room air.  Chest x-ray on my independent review interpretation is unremarkable.  His EKG demonstrated no evidence of acute ischemia or right heart strain and I do not think pulmonary embolism is very likely as he has no risk factors.  He does not have any evidence of CO2 deviation or an elevated anion gap and his glucose has been elevated on 2 occasions.  He does have a very mild leukocytosis of 11.8 which may be reactive.  Does not quite meet criteria for sepsis but definitely does have SIRS criteria.  Will treat with IV fluids, Toradol , Tylenol , Pepcid , Afrin and nasal saline.  Will reassess for improvement.  Given his brief chronicity of symptoms there is a high probability that his COVID and influenza test will return unremarkable and he was advised such we will focus on symptom control at this time   Clinical Course as of 11/05/24 0041  Thu Nov 04, 2024  1456 Temp: 100.2 F (37.9 C) [HD]  1645 Lactic Acid, Venous: 1.8 Not Elevated [HD]  1706 Resp panel by RT-PCR (RSV, Flu A&B, Covid) Anterior Nasal  Swab(!) COVID-positive [HD]  1712 Patient feels improved.  I reviewed the indications benefits risks and alternatives of Paxlovid  with the patient which he is a candidate given his elevated BMI.  He voiced understanding and states that he would not want this medication and that he wants to fight it out.  Will send prescription for Zofran  and ketorolac  for his pharmacy of choice [HD]    Clinical Course User Index [HD] Nicholaus Rolland BRAVO, MD   At time of  discharge there is no evidence of acute life, limb, vision, or fertility threat. Patient has stable vital signs, pain is well controlled, patient is ambulatory and p.o. tolerant.  Discharge instructions were completed using the EPIC system. I would refer you to those at this time. All warnings prescriptions follow-up etc. were discussed in detail with the patient. Patient indicates understanding and is agreeable with this plan. All questions answered.  Patient is made aware that they may return to the emergency department for any worsening or new condition or for any other emergency.   -- Risk: 5 This patient has a high risk of morbidity due to further diagnostic testing or treatment. Rationale: This patient's evaluation and management involve a high risk of morbidity due to the potential severity of presenting symptoms, need for diagnostic testing, and/or initiation of treatment that may require close monitoring. The differential includes conditions with potential for significant deterioration or requiring escalation of care. Treatment decisions in the ED, including medication administration, procedural interventions, or disposition planning, reflect this level of risk. COPA: 5 The patient has the following acute or chronic illness/injury that poses a possible threat to life or bodily function: [X] : The patient has a potentially serious acute condition or an acute exacerbation of a chronic illness requiring urgent evaluation and management in the  Emergency Department. The clinical presentation necessitates immediate consideration of life-threatening or function-threatening diagnoses, even if they are ultimately ruled out.   FINAL CLINICAL IMPRESSION(S) / ED DIAGNOSES   Final diagnoses:  Nasal congestion  Cough, unspecified type  Body aches  COVID-19     Rx / DC Orders   ED Discharge Orders          Ordered    ondansetron  (ZOFRAN -ODT) 4 MG disintegrating tablet  Every 8 hours PRN        11/04/24 1713    ketorolac  (TORADOL ) 10 MG tablet  Every 6 hours PRN        11/04/24 1713             Note:  This document was prepared using Dragon voice recognition software and may include unintentional dictation errors.   Nicholaus Rolland BRAVO, MD 11/05/24 [phone]
